# Patient Record
Sex: Female | Born: 1965 | Race: White | Hispanic: No | Marital: Married | Smoking: Never smoker
Health system: Southern US, Community
[De-identification: ages and names within clinical notes are randomized; demographics above are authoritative.]

## PROBLEM LIST (undated history)

## (undated) DIAGNOSIS — T7840XA Allergy, unspecified, initial encounter: Secondary | ICD-10-CM

## (undated) DIAGNOSIS — K649 Unspecified hemorrhoids: Secondary | ICD-10-CM

## (undated) DIAGNOSIS — M8580 Other specified disorders of bone density and structure, unspecified site: Secondary | ICD-10-CM

## (undated) DIAGNOSIS — M1990 Unspecified osteoarthritis, unspecified site: Secondary | ICD-10-CM

## (undated) DIAGNOSIS — G4700 Insomnia, unspecified: Secondary | ICD-10-CM

## (undated) DIAGNOSIS — K635 Polyp of colon: Secondary | ICD-10-CM

## (undated) HISTORY — DX: Unspecified hemorrhoids: K64.9

## (undated) HISTORY — DX: Insomnia, unspecified: G47.00

## (undated) HISTORY — DX: Unspecified osteoarthritis, unspecified site: M19.90

## (undated) HISTORY — DX: Allergy, unspecified, initial encounter: T78.40XA

## (undated) HISTORY — DX: Other specified disorders of bone density and structure, unspecified site: M85.80

## (undated) HISTORY — DX: Polyp of colon: K63.5

---
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
## 1998-10-31 ENCOUNTER — Ambulatory Visit (HOSPITAL_COMMUNITY): Admission: RE | Admit: 1998-10-31 | Discharge: 1998-10-31 | Payer: Self-pay | Admitting: Obstetrics and Gynecology

## 1999-07-24 ENCOUNTER — Inpatient Hospital Stay (HOSPITAL_COMMUNITY): Admission: AD | Admit: 1999-07-24 | Discharge: 1999-07-28 | Payer: Self-pay | Admitting: *Deleted

## 2002-01-28 ENCOUNTER — Other Ambulatory Visit: Admission: RE | Admit: 2002-01-28 | Discharge: 2002-01-28 | Payer: Self-pay | Admitting: Obstetrics and Gynecology

## 2003-04-20 ENCOUNTER — Other Ambulatory Visit: Admission: RE | Admit: 2003-04-20 | Discharge: 2003-04-20 | Payer: Self-pay | Admitting: Obstetrics and Gynecology

## 2004-05-31 ENCOUNTER — Other Ambulatory Visit: Admission: RE | Admit: 2004-05-31 | Discharge: 2004-05-31 | Payer: Self-pay | Admitting: Obstetrics and Gynecology

## 2005-02-02 ENCOUNTER — Ambulatory Visit (HOSPITAL_COMMUNITY): Admission: RE | Admit: 2005-02-02 | Discharge: 2005-02-02 | Payer: Self-pay | Admitting: Family Medicine

## 2005-09-10 ENCOUNTER — Other Ambulatory Visit: Admission: RE | Admit: 2005-09-10 | Discharge: 2005-09-10 | Payer: Self-pay | Admitting: Obstetrics and Gynecology

## 2013-10-15 DIAGNOSIS — M8580 Other specified disorders of bone density and structure, unspecified site: Secondary | ICD-10-CM

## 2013-10-15 HISTORY — DX: Other specified disorders of bone density and structure, unspecified site: M85.80

## 2015-10-16 DIAGNOSIS — K635 Polyp of colon: Secondary | ICD-10-CM

## 2015-10-16 HISTORY — PX: BUNIONECTOMY: SHX129

## 2015-10-16 HISTORY — DX: Polyp of colon: K63.5

## 2015-12-26 ENCOUNTER — Telehealth: Payer: Self-pay | Admitting: Family Medicine

## 2015-12-26 NOTE — Telephone Encounter (Signed)
Pt would like to know if you would accept her as a pt?  Pt has [employer], and was referred by one of Dr Julianne Rice pt's. Please advise if she will accept?

## 2016-01-04 NOTE — Telephone Encounter (Signed)
OK to schedule

## 2016-01-05 NOTE — Telephone Encounter (Signed)
Left message to call back  

## 2016-01-09 NOTE — Telephone Encounter (Signed)
Pt has decided to go somewhere else.

## 2016-01-24 ENCOUNTER — Encounter: Payer: Self-pay | Admitting: Family Medicine

## 2016-01-24 DIAGNOSIS — Z124 Encounter for screening for malignant neoplasm of cervix: Secondary | ICD-10-CM | POA: Insufficient documentation

## 2016-01-24 DIAGNOSIS — Z79899 Other long term (current) drug therapy: Secondary | ICD-10-CM | POA: Insufficient documentation

## 2016-01-24 DIAGNOSIS — Z780 Asymptomatic menopausal state: Secondary | ICD-10-CM

## 2016-01-24 DIAGNOSIS — M8580 Other specified disorders of bone density and structure, unspecified site: Secondary | ICD-10-CM | POA: Insufficient documentation

## 2016-01-24 HISTORY — DX: Asymptomatic menopausal state: Z78.0

## 2016-03-15 ENCOUNTER — Ambulatory Visit (INDEPENDENT_AMBULATORY_CARE_PROVIDER_SITE_OTHER): Payer: 59 | Admitting: Family Medicine

## 2016-03-15 ENCOUNTER — Encounter: Payer: Self-pay | Admitting: Family Medicine

## 2016-03-15 VITALS — BP 129/89 | HR 78 | Temp 98.3°F | Resp 20 | Ht 62.5 in | Wt 114.8 lb

## 2016-03-15 DIAGNOSIS — J0100 Acute maxillary sinusitis, unspecified: Secondary | ICD-10-CM | POA: Diagnosis not present

## 2016-03-15 DIAGNOSIS — Z7689 Persons encountering health services in other specified circumstances: Secondary | ICD-10-CM

## 2016-03-15 DIAGNOSIS — Z7189 Other specified counseling: Secondary | ICD-10-CM | POA: Diagnosis not present

## 2016-03-15 HISTORY — PX: HEMORRHOID BANDING: SHX5850

## 2016-03-15 MED ORDER — AZITHROMYCIN 250 MG PO TABS: ORAL_TABLET | ORAL | Status: DC

## 2016-03-15 NOTE — Progress Notes (Signed)
Patient ID: Sheena Brennan, female   DOB: July 03, 1966, 50 y.o.   MRN: [id number]    Sheena Brennan , 04/12/1966, 50 y.o., female MRN: [id number]  CC: fever, establish care Subjective: Pt presents for an acute OV with complaints of fever of 2 days duration. Associated symptoms include muscle aches, nausea, no appetite, sore throat, headache.  Fever tmax 101F. patient reports some of her family members all her sick, but not will same illness. She had been exposed to her infant nephew over the weekend, who had some nasal drainage. Pt denies nausea, diarrhea, abdominal pain, rash, nasal congestion, PND, cough, wheezing, shortness of breath. Pt has tried tylenol for fever to ease their symptoms.  Patient presents for new patient establishment. All past medical history, surgical history, allergies, family history, immunizations and social history was obtained from the patient today and entered into the electronic medical record. Records reviewed from prior PCP.    Allergies  Allergen Reactions  . Tetracyclines & Related Nausea And Vomiting   Social History  Substance Use Topics  . Smoking status: Never Smoker   . Smokeless tobacco: Never Used  . Alcohol Use: No   Past Medical History  Diagnosis Date  . Insomnia   . Osteopenia 2015    By DEXA  . Arthritis   . Allergy   . Colon polyp    Past Surgical History  Procedure Laterality Date  . Cesarean section  1997, 2000   Family History  Problem Relation Age of Onset  . Osteoporosis    . Arthritis Mother   . Breast cancer Maternal Grandmother   . Heart disease Father    Social History   Social History Narrative   Married to [employer]. 2 children (eric and brian)   Bachelors degree, Optometrist, employed.   Denies tobacco, drug or alcohol use.   Drinks caffeinated beverages.   Takes a daily vitamin.   Exercise routinely.   Wears her seatbelt, smoke detector in the home   Firearms in a locked cabinet In the home   Feels safe in her  relationships.      Medication List       This list is accurate as of: 03/15/16 11:59 PM.  Always use your most recent med list.               azithromycin 250 MG tablet  Commonly known as:  ZITHROMAX  500 mg day 1, then 250 mg     zolpidem 10 MG tablet  Commonly known as:  AMBIEN  Take 10 mg by mouth at bedtime as needed.       ROS: Negative, with the exception of above mentioned in HPI  Objective:  BP 129/89 mmHg  Pulse 78  Temp(Src) 98.3 F (36.8 C) (Oral)  Resp 20  Ht 5' 2.5" (1.588 m)  Wt 114 lb 12 oz (52.05 kg)  BMI 20.64 kg/m2  SpO2 97% Body mass index is 20.64 kg/(m^2). Gen: Afebrile. No acute distress. On toxic in appearance, well-developed, well-nourished female. Pleasant. HENT: AT. [place]. Bilateral TM visualized, with air-fluid levels bilaterally. Tacky mucous membranes, no oral lesions. Bilateral nares erythema and swelling. Throat without erythema or exudates. Mild cough on exam, no hoarseness on exam. No tenderness to palpation maxillary or frontal sinuses. Eyes:Pupils Equal Round Reactive to light, Extraocular movements intact,  Conjunctiva without redness, discharge or icterus. Neck/lymp/endocrine: Supple, no lymphadenopath CV: RRR  Chest: CTAB, no wheeze or crackles. Good air movement, normal resp effort.  Abd: Soft. NTND.  BS present Skin: No rashes, purpura or petechiae.  Neuro: Normal gait. PERLA. EOMi. Alert. Oriented x3 . Psych: Normal affect, dress and demeanor. Normal speech. Normal thought content and judgment.   Assessment/Plan: Sheena Brennan is a 50 y.o. female present for establishment of care and acute OV for  Acute maxillary sinusitis, recurrence not specified - Flonase, Mucinex, rest, hydrate. Antibiotic prescribed and printed, only to be filled if no improvement after 5 days of illness. Or worsening symptoms. - azithromycin (ZITHROMAX) 250 MG tablet; 500 mg day 1, then 250 mg  Dispense: 6 tablet; Refill: 0  Greater than 45 minutes was  spent with patient, greater than 50% of that time was spent face-to-face with patient counseling and coordinating care.   electronically signed by:  Howard Pouch, DO  [place]

## 2016-03-15 NOTE — Patient Instructions (Signed)
Flonase, Mucinex.  REST, HYDRATE Z-pack start on Saturday if not improving.    Adenovirus Adenoviruses are common viruses that cause many different types of infections. The common cold (upper respiratory infection) is the most common type of infection from an adenovirus. Adenoviruses can also infect your digestive system, eyes, lungs, and bladder. An adenovirus spreads easily from person to person. This is especially true if you are in close contact with someone who is sick. You can breathe in the virus after a sick person coughs or sneezes. Adenoviruses can live outside the body for many weeks. Therefore, you can also get sick after touching an object the virus is living on and then touching your nose or mouth. Adenovirus infections are usually not serious unless you have another health problem that makes it hard for you to fight off the infection.  CAUSES  There are more than 50 types of adenoviruses that can cause infections in humans. Different types of adenoviruses cause different types of infection.  RISK FACTORS The risk for an adenovirus infection is higher for:  People who spend a lot of time in places where there are lots of other people. This includes schools, summer camps, and day care centers.  Babies.  Elderly people.  People with a weak body defense system (immune system).  People with heart or lung disease. SIGNS AND SYMPTOMS  It can take 2-14 days to develop symptoms after the virus gets into your body. Symptoms may include:  Fever.  Sore throat.  Ear pain or fullness.  Nasal congestion.  Cough.  Difficulty breathing.  Stomachache.  Diarrhea.  Pain while passing urine.  Blood in the urine.  Pinkeye (conjunctivitis). DIAGNOSIS  Your health care provider may diagnose an adenovirus infection from your signs and symptoms. A physical exam will also be done. You may have tests to make sure your symptoms are not caused by another type of problem. These can  include a blood test, throat culture, or chest X-ray. TREATMENT  There is no treatment for an adenovirus infection. These infections usually clear up on their own with home care. Your health care provider may recommend over-the-counter medicine to help relieve a sore throat, fever, or headache. HOME CARE INSTRUCTIONS  Rest at home until your symptoms go away.  Drink enough fluid to keep your urine clear or pale yellow.  Take medicines only as directed by your health care provider. PREVENTION   Wash your hands often with soap and water.  Avoid close contact with people who are sick.  Do not go to school or work when you are sick.  Cover your nose or mouth when you sneeze or cough. SEEK MEDICAL CARE IF: Your symptoms of adenovirus infection do not clear up or are getting worse after several days. SEEK IMMEDIATE MEDICAL CARE IF: You have trouble breathing. MAKE SURE YOU:  Understand these instructions.  Will watch your condition.  Will get help right away if you are not doing well or get worse.   This information is not intended to replace advice given to you by your health care provider. Make sure you discuss any questions you have with your health care provider.   Document Released: 12/22/2002 Document Revised: 10/22/2014 Document Reviewed: 02/03/2014 Elsevier Interactive Patient Education [employer].

## 2016-03-19 ENCOUNTER — Encounter: Payer: Self-pay | Admitting: Family Medicine

## 2016-03-19 ENCOUNTER — Ambulatory Visit: Payer: Self-pay | Admitting: Family Medicine

## 2016-04-09 ENCOUNTER — Encounter: Payer: Self-pay | Admitting: Family Medicine

## 2016-04-09 ENCOUNTER — Ambulatory Visit (INDEPENDENT_AMBULATORY_CARE_PROVIDER_SITE_OTHER): Payer: 59 | Admitting: Family Medicine

## 2016-04-09 VITALS — BP 115/80 | HR 70 | Temp 98.3°F | Resp 20 | Ht 63.0 in | Wt 114.5 lb

## 2016-04-09 DIAGNOSIS — R0683 Snoring: Secondary | ICD-10-CM | POA: Diagnosis not present

## 2016-04-09 DIAGNOSIS — Z23 Encounter for immunization: Secondary | ICD-10-CM

## 2016-04-09 DIAGNOSIS — Z131 Encounter for screening for diabetes mellitus: Secondary | ICD-10-CM

## 2016-04-09 DIAGNOSIS — J342 Deviated nasal septum: Secondary | ICD-10-CM

## 2016-04-09 DIAGNOSIS — E559 Vitamin D deficiency, unspecified: Secondary | ICD-10-CM

## 2016-04-09 DIAGNOSIS — R6889 Other general symptoms and signs: Secondary | ICD-10-CM

## 2016-04-09 DIAGNOSIS — Z0001 Encounter for general adult medical examination with abnormal findings: Secondary | ICD-10-CM

## 2016-04-09 DIAGNOSIS — Z114 Encounter for screening for human immunodeficiency virus [HIV]: Secondary | ICD-10-CM

## 2016-04-09 DIAGNOSIS — Z1329 Encounter for screening for other suspected endocrine disorder: Secondary | ICD-10-CM

## 2016-04-09 DIAGNOSIS — G4700 Insomnia, unspecified: Secondary | ICD-10-CM | POA: Insufficient documentation

## 2016-04-09 DIAGNOSIS — Z13220 Encounter for screening for lipoid disorders: Secondary | ICD-10-CM

## 2016-04-09 DIAGNOSIS — Z130 Encounter for screening for diseases of the blood and blood-forming organs and certain disorders involving the immune mechanism: Secondary | ICD-10-CM | POA: Diagnosis not present

## 2016-04-09 DIAGNOSIS — M8580 Other specified disorders of bone density and structure, unspecified site: Secondary | ICD-10-CM

## 2016-04-09 DIAGNOSIS — Z1159 Encounter for screening for other viral diseases: Secondary | ICD-10-CM

## 2016-04-09 LAB — LIPID PANEL
Cholesterol: 209 mg/dL — ABNORMAL HIGH (ref 0–200)
HDL: 65.9 mg/dL (ref >39.00)
LDL CALC: 129 mg/dL — AB (ref 0–99)
NonHDL: 143.47
Total CHOL/HDL Ratio: 3
Triglycerides: 71 mg/dL (ref 0.0–149.0)
VLDL: 14.2 mg/dL (ref 0.0–40.0)

## 2016-04-09 LAB — CBC WITH DIFFERENTIAL/PLATELET
BASOS ABS: 0 10*3/uL (ref 0.0–0.1)
Basophils Relative: 0.8 % (ref 0.0–3.0)
EOS ABS: 0 10*3/uL (ref 0.0–0.7)
Eosinophils Relative: 0.4 % (ref 0.0–5.0)
HCT: 41.8 % (ref 36.0–46.0)
Hemoglobin: 14.1 g/dL (ref 12.0–15.0)
LYMPHS ABS: 1.9 10*3/uL (ref 0.7–4.0)
LYMPHS PCT: 40.1 % (ref 12.0–46.0)
MCHC: 33.8 g/dL (ref 30.0–36.0)
MCV: 91.4 fl (ref 78.0–100.0)
Monocytes Absolute: 0.3 10*3/uL (ref 0.1–1.0)
Monocytes Relative: 7 % (ref 3.0–12.0)
NEUTROS ABS: 2.5 10*3/uL (ref 1.4–7.7)
NEUTROS PCT: 51.7 % (ref 43.0–77.0)
PLATELETS: 190 10*3/uL (ref 150.0–400.0)
RBC: 4.57 Mil/uL (ref 3.87–5.11)
RDW: 13.6 % (ref 11.5–15.5)
WBC: 4.8 10*3/uL (ref 4.0–10.5)

## 2016-04-09 LAB — COMPREHENSIVE METABOLIC PANEL
ALT: 14 U/L (ref 0–35)
AST: 21 U/L (ref 0–37)
Albumin: 4.4 g/dL (ref 3.5–5.2)
Alkaline Phosphatase: 55 U/L (ref 39–117)
BILIRUBIN TOTAL: 1.3 mg/dL — AB (ref 0.2–1.2)
BUN: 15 mg/dL (ref 6–23)
CO2: 30 meq/L (ref 19–32)
CREATININE: 0.71 mg/dL (ref 0.40–1.20)
Calcium: 9.8 mg/dL (ref 8.4–10.5)
Chloride: 102 mEq/L (ref 96–112)
GFR: 91.74 mL/min (ref >60.00)
GLUCOSE: 84 mg/dL (ref 70–99)
Potassium: 3.5 mEq/L (ref 3.5–5.1)
SODIUM: 138 meq/L (ref 135–145)
Total Protein: 6.9 g/dL (ref 6.0–8.3)

## 2016-04-09 LAB — TSH: TSH: 1.46 u[IU]/mL (ref 0.35–4.50)

## 2016-04-09 LAB — VITAMIN D 25 HYDROXY (VIT D DEFICIENCY, FRACTURES): VITD: 36.17 ng/mL (ref 30.00–100.00)

## 2016-04-09 LAB — HEMOGLOBIN A1C: Hgb A1c MFr Bld: 5.3 % (ref 4.6–6.5)

## 2016-04-09 MED ORDER — TRAZODONE HCL 50 MG PO TABS: 25.0000 mg | ORAL_TABLET | Freq: Every evening | ORAL | Status: DC | PRN
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
## 2016-04-09 NOTE — Patient Instructions (Addendum)
Health Maintenance, Female Adopting a healthy lifestyle and getting preventive care can go a long way to promote health and wellness. Talk with your health care provider about what schedule of regular examinations is right for you. This is a good chance for you to check in with your provider about disease prevention and staying healthy. In between checkups, there are plenty of things you can do on your own. Experts have done a lot of research about which lifestyle changes and preventive measures are most likely to keep you healthy. Ask your health care provider for more information. WEIGHT AND DIET  Eat a healthy diet  Be sure to include plenty of vegetables, fruits, low-fat dairy products, and lean protein.  Do not eat a lot of foods high in solid fats, added sugars, or salt.  Get regular exercise. This is one of the most important things you can do for your health.  Most adults should exercise for at least 150 minutes each week. The exercise should increase your heart rate and make you sweat (moderate-intensity exercise).  Most adults should also do strengthening exercises at least twice a week. This is in addition to the moderate-intensity exercise.  Maintain a healthy weight  Body mass index (BMI) is a measurement that can be used to identify possible weight problems. It estimates body fat based on height and weight. Your health care provider can help determine your BMI and help you achieve or maintain a healthy weight.  For females 20 years of age and older:   A BMI below 18.5 is considered underweight.  A BMI of 18.5 to 24.9 is normal.  A BMI of 25 to 29.9 is considered overweight.  A BMI of 30 and above is considered obese.  Watch levels of cholesterol and blood lipids  You should start having your blood tested for lipids and cholesterol at 52 years of age, then have this test every 5 years.  You may need to have your cholesterol levels checked more often if:  Your lipid  or cholesterol levels are high.  You are older than 52 years of age.  You are at high risk for heart disease.  CANCER SCREENING   Lung Cancer  Lung cancer screening is recommended for adults 55-80 years old who are at high risk for lung cancer because of a history of smoking.  A yearly low-dose CT scan of the lungs is recommended for people who:  Currently smoke.  Have quit within the past 15 years.  Have at least a 30-pack-year history of smoking. A pack year is smoking an average of one pack of cigarettes a day for 1 year.  Yearly screening should continue until it has been 15 years since you quit.  Yearly screening should stop if you develop a health problem that would prevent you from having lung cancer treatment.  Breast Cancer  Practice breast self-awareness. This means understanding how your breasts normally appear and feel.  It also means doing regular breast self-exams. Let your health care provider know about any changes, no matter how small.  If you are in your 20s or 30s, you should have a clinical breast exam (CBE) by a health care provider every 1-3 years as part of a regular health exam.  If you are 40 or older, have a CBE every year. Also consider having a breast X-ray (mammogram) every year.  If you have a family history of breast cancer, talk to your health care provider about genetic screening.  If you   are at high risk for breast cancer, talk to your health care provider about having an MRI and a mammogram every year.  Breast cancer gene (BRCA) assessment is recommended for women who have family members with BRCA-related cancers. BRCA-related cancers include:  Breast.  Ovarian.  Tubal.  Peritoneal cancers.  Results of the assessment will determine the need for genetic counseling and BRCA1 and BRCA2 testing. Cervical Cancer Your health care provider may recommend that you be screened regularly for cancer of the pelvic organs (ovaries, uterus, and  vagina). This screening involves a pelvic examination, including checking for microscopic changes to the surface of your cervix (Pap test). You may be encouraged to have this screening done every 3 years, beginning at age 21.  For women ages 30-65, health care providers may recommend pelvic exams and Pap testing every 3 years, or they may recommend the Pap and pelvic exam, combined with testing for human papilloma virus (HPV), every 5 years. Some types of HPV increase your risk of cervical cancer. Testing for HPV may also be done on women of any age with unclear Pap test results.  Other health care providers may not recommend any screening for nonpregnant women who are considered low risk for pelvic cancer and who do not have symptoms. Ask your health care provider if a screening pelvic exam is right for you.  If you have had past treatment for cervical cancer or a condition that could lead to cancer, you need Pap tests and screening for cancer for at least 20 years after your treatment. If Pap tests have been discontinued, your risk factors (such as having a new sexual partner) need to be reassessed to determine if screening should resume. Some women have medical problems that increase the chance of getting cervical cancer. In these cases, your health care provider may recommend more frequent screening and Pap tests. Colorectal Cancer  This type of cancer can be detected and often prevented.  Routine colorectal cancer screening usually begins at 52 years of age and continues through 52 years of age.  Your health care provider may recommend screening at an earlier age if you have risk factors for colon cancer.  Your health care provider may also recommend using home test kits to check for hidden blood in the stool.  A small camera at the end of a tube can be used to examine your colon directly (sigmoidoscopy or colonoscopy). This is done to check for the earliest forms of colorectal  cancer.  Routine screening usually begins at age 50.  Direct examination of the colon should be repeated every 5-10 years through 52 years of age. However, you may need to be screened more often if early forms of precancerous polyps or small growths are found. Skin Cancer  Check your skin from head to toe regularly.  Tell your health care provider about any new moles or changes in moles, especially if there is a change in a mole's shape or color.  Also tell your health care provider if you have a mole that is larger than the size of a pencil eraser.  Always use sunscreen. Apply sunscreen liberally and repeatedly throughout the day.  Protect yourself by wearing long sleeves, pants, a wide-brimmed hat, and sunglasses whenever you are outside. HEART DISEASE, DIABETES, AND HIGH BLOOD PRESSURE   High blood pressure causes heart disease and increases the risk of stroke. High blood pressure is more likely to develop in:  People who have blood pressure in the high end   of the normal range (130-139/85-89 mm Hg).  People who are overweight or obese.  People who are African American.  If you are 38-23 years of age, have your blood pressure checked every 3-5 years. If you are 61 years of age or older, have your blood pressure checked every year. You should have your blood pressure measured twice--once when you are at a hospital or clinic, and once when you are not at a hospital or clinic. Record the average of the two measurements. To check your blood pressure when you are not at a hospital or clinic, you can use:  An automated blood pressure machine at a pharmacy.  A home blood pressure monitor.  If you are between 45 years and 39 years old, ask your health care provider if you should take aspirin to prevent strokes.  Have regular diabetes screenings. This involves taking a blood sample to check your fasting blood sugar level.  If you are at a normal weight and have a low risk for diabetes,  have this test once every three years after 52 years of age.  If you are overweight and have a high risk for diabetes, consider being tested at a younger age or more often. PREVENTING INFECTION  Hepatitis B  If you have a higher risk for hepatitis B, you should be screened for this virus. You are considered at high risk for hepatitis B if:  You were born in a country where hepatitis B is common. Ask your health care provider which countries are considered high risk.  Your parents were born in a high-risk country, and you have not been immunized against hepatitis B (hepatitis B vaccine).  You have HIV or AIDS.  You use needles to inject street drugs.  You live with someone who has hepatitis B.  You have had sex with someone who has hepatitis B.  You get hemodialysis treatment.  You take certain medicines for conditions, including cancer, organ transplantation, and autoimmune conditions. Hepatitis C  Blood testing is recommended for:  Everyone born from [birth year] through 1967.  Anyone with known risk factors for hepatitis C. Sexually transmitted infections (STIs)  You should be screened for sexually transmitted infections (STIs) including gonorrhea and chlamydia if:  You are sexually active and are younger than 52 years of age.  You are older than 53 years of age and your health care provider tells you that you are at risk for this type of infection.  Your sexual activity has changed since you were last screened and you are at an increased risk for chlamydia or gonorrhea. Ask your health care provider if you are at risk.  If you do not have HIV, but are at risk, it may be recommended that you take a prescription medicine daily to prevent HIV infection. This is called pre-exposure prophylaxis (PrEP). You are considered at risk if:  You are sexually active and do not regularly use condoms or know the HIV status of your partner(s).  You take drugs by injection.  You are sexually  active with a partner who has HIV. Talk with your health care provider about whether you are at high risk of being infected with HIV. If you choose to begin PrEP, you should first be tested for HIV. You should then be tested every 3 months for as long as you are taking PrEP.  PREGNANCY   If you are premenopausal and you may become pregnant, ask your health care provider about preconception counseling.  If you may  become pregnant, take 400 to 800 micrograms (mcg) of folic acid every day.  If you want to prevent pregnancy, talk to your health care provider about birth control (contraception). OSTEOPOROSIS AND MENOPAUSE   Osteoporosis is a disease in which the bones lose minerals and strength with aging. This can result in serious bone fractures. Your risk for osteoporosis can be identified using a bone density scan.  If you are 65 years of age or older, or if you are at risk for osteoporosis and fractures, ask your health care provider if you should be screened.  Ask your health care provider whether you should take a calcium or vitamin D supplement to lower your risk for osteoporosis.  Menopause may have certain physical symptoms and risks.  Hormone replacement therapy may reduce some of these symptoms and risks. Talk to your health care provider about whether hormone replacement therapy is right for you.  HOME CARE INSTRUCTIONS   Schedule regular health, dental, and eye exams.  Stay current with your immunizations.   Do not use any tobacco products including cigarettes, chewing tobacco, or electronic cigarettes.  If you are pregnant, do not drink alcohol.  If you are breastfeeding, limit how much and how often you drink alcohol.  Limit alcohol intake to no more than 1 drink per day for nonpregnant women. One drink equals 12 ounces of beer, 5 ounces of wine, or 1 ounces of hard liquor.  Do not use street drugs.  Do not share needles.  Ask your health care provider for help if  you need support or information about quitting drugs.  Tell your health care provider if you often feel depressed.  Tell your health care provider if you have ever been abused or do not feel safe at home.   This information is not intended to replace advice given to you by your health care provider. Make sure you discuss any questions you have with your health care provider.   Document Released: 04/16/2011 Document Revised: 10/22/2014 Document Reviewed: 09/02/2013 Elsevier Interactive Patient Education 2016 [employer]  1000-1200 calcium a day. I will check your Vitmain D.

## 2016-04-10 ENCOUNTER — Telehealth: Payer: Self-pay | Admitting: Family Medicine

## 2016-04-10 LAB — HEPATITIS C ANTIBODY: HCV AB: NEGATIVE

## 2016-04-10 LAB — HIV ANTIBODY (ROUTINE TESTING W REFLEX): HIV 1&2 Ab, 4th Generation: NONREACTIVE

## 2016-04-10 NOTE — Telephone Encounter (Signed)
Please call pt: - all of her labs look good. Her vitamin D level is low normal. She could benefit from increasing her dose (not certain total she is taking now).

## 2016-04-10 NOTE — Telephone Encounter (Signed)
Spoke with patient reviewed lab results and instructions. 

## 2016-07-16 LAB — HM MAMMOGRAPHY

## 2016-08-08 LAB — HM PAP SMEAR: HM Pap smear: NORMAL

## 2016-10-22 DIAGNOSIS — M2011 Hallux valgus (acquired), right foot: Secondary | ICD-10-CM | POA: Diagnosis not present

## 2016-10-22 DIAGNOSIS — M2012 Hallux valgus (acquired), left foot: Secondary | ICD-10-CM | POA: Diagnosis not present

## 2016-11-03 DIAGNOSIS — M21611 Bunion of right foot: Secondary | ICD-10-CM | POA: Diagnosis not present

## 2016-11-03 DIAGNOSIS — M21612 Bunion of left foot: Secondary | ICD-10-CM | POA: Diagnosis not present

## 2016-11-07 DIAGNOSIS — M21612 Bunion of left foot: Secondary | ICD-10-CM | POA: Diagnosis not present

## 2016-11-07 DIAGNOSIS — M21611 Bunion of right foot: Secondary | ICD-10-CM | POA: Diagnosis not present

## 2016-11-12 DIAGNOSIS — M21611 Bunion of right foot: Secondary | ICD-10-CM | POA: Diagnosis not present

## 2016-11-12 DIAGNOSIS — M21612 Bunion of left foot: Secondary | ICD-10-CM | POA: Diagnosis not present

## 2016-11-16 DIAGNOSIS — M21612 Bunion of left foot: Secondary | ICD-10-CM | POA: Diagnosis not present

## 2016-11-16 DIAGNOSIS — M21611 Bunion of right foot: Secondary | ICD-10-CM | POA: Diagnosis not present

## 2016-11-21 DIAGNOSIS — M21612 Bunion of left foot: Secondary | ICD-10-CM | POA: Diagnosis not present

## 2016-11-21 DIAGNOSIS — M21611 Bunion of right foot: Secondary | ICD-10-CM | POA: Diagnosis not present

## 2017-03-13 ENCOUNTER — Encounter: Payer: Self-pay | Admitting: Family Medicine

## 2017-03-13 ENCOUNTER — Ambulatory Visit (INDEPENDENT_AMBULATORY_CARE_PROVIDER_SITE_OTHER): Payer: 59 | Admitting: Family Medicine

## 2017-03-13 VITALS — BP 124/81 | HR 84 | Temp 99.7°F | Resp 18 | Wt 115.5 lb

## 2017-03-13 DIAGNOSIS — R110 Nausea: Secondary | ICD-10-CM | POA: Diagnosis not present

## 2017-03-13 DIAGNOSIS — R509 Fever, unspecified: Secondary | ICD-10-CM | POA: Diagnosis not present

## 2017-03-13 DIAGNOSIS — R8290 Unspecified abnormal findings in urine: Secondary | ICD-10-CM

## 2017-03-13 DIAGNOSIS — R319 Hematuria, unspecified: Secondary | ICD-10-CM | POA: Diagnosis not present

## 2017-03-13 HISTORY — DX: Hematuria, unspecified: R31.9

## 2017-03-13 LAB — POC URINALSYSI DIPSTICK (AUTOMATED)
GLUCOSE UA: NEGATIVE
Leukocytes, UA: NEGATIVE
NITRITE UA: NEGATIVE
Protein, UA: 30
Spec Grav, UA: 1.025 (ref 1.010–1.025)
Urobilinogen, UA: 0.2 E.U./dL
pH, UA: 6 (ref 5.0–8.0)

## 2017-03-13 MED ORDER — ONDANSETRON HCL 4 MG PO TABS: 4.0000 mg | ORAL_TABLET | Freq: Three times a day (TID) | ORAL | 0 refills | Status: DC | PRN

## 2017-03-13 NOTE — Patient Instructions (Signed)
You do have a "moderate" content of microscopic blood in your urine.  It does not show evidence of infection. I will send it for culture to make sure.  For now, treat with naproxen every 12 hours for 2-3 days. If you experience pain or worsening symptoms be seen immediately.   I have provided you with a script for zofran in the event nausea worsens. Make sure to stay hydrated as we discussed.    Kidney Stones Kidney stones (urolithiasis) are rock-like masses that form inside of the kidneys. Kidneys are organs that make pee (urine). A kidney stone can cause very bad pain and can block the flow of pee. The stone usually leaves your body (passes) through your pee. You may need to have a doctor take out the stone. Follow these instructions at home: Eating and drinking   Drink enough fluid to keep your pee clear or pale yellow. This will help you pass the stone.  If told by your doctor, change the foods you eat (your diet). This may include:  Limiting how much salt (sodium) you eat.  Eating more fruits and vegetables.  Limiting how much meat, poultry, fish, and eggs you eat.  Follow instructions from your doctor about eating or drinking restrictions. General instructions   Collect pee samples as told by your doctor. You may need to collect a pee sample:  24 hours after a stone comes out.  8-12 weeks after a stone comes out, and every 6-12 months after that.  Strain your pee every time you pee (urinate), for as long as told. Use the strainer that your doctor recommends.  Do not throw out the stone. Keep it so that it can be tested by your doctor.  Take over-the-counter and prescription medicines only as told by your doctor.  Keep all follow-up visits as told by your doctor. This is important. You may need follow-up tests. Preventing kidney stones  To prevent another kidney stone:  Drink enough fluid to keep your pee clear or pale yellow. This is the best way to prevent kidney  stones.  Eat healthy foods.  Avoid certain foods as told by your doctor. You may be told to eat less protein.  Stay at a healthy weight. Contact a doctor if:  You have pain that gets worse or does not get better with medicine. Get help right away if:  You have a fever or chills.  You get very bad pain.  You get new pain in your belly (abdomen).  You pass out (faint).  You cannot pee. This information is not intended to replace advice given to you by your health care provider. Make sure you discuss any questions you have with your health care provider. Document Released: 03/19/2008 Document Revised: 06/19/2016 Document Reviewed: 06/19/2016 Elsevier Interactive Patient Education  2017 [employer].

## 2017-03-13 NOTE — Progress Notes (Addendum)
Sheena Brennan , 09-Oct-1966, 51 y.o., female MRN: [id number] Patient Care Team    Relationship Specialty Notifications Start End  Ma Hillock, DO PCP - General Family Medicine  03/15/16   Richmond Campbell, MD Consulting Physician Gastroenterology  03/19/16   Allyn Kenner, [place] Physician Obstetrics and Gynecology  03/19/16     Chief Complaint  Patient presents with  . Fever    nausea, x 1 day     Subjective: Pt presents for an OV with complaints of fever of 1 day duration.  Associated symptoms include fever 102 F max, Nausea and mild achiness. Pt denies rash, abd pain, dysuria, cough, recent travel, insect bites, h/o kidney stones. Pt endorses nausea, fever and maybe mild right lower back discomfort.  Pt has tried Tylenol to ease their symptoms. She has not been exposed to any known sick contacts.  Depression screen [facility] 2/9 04/09/2016  Decreased Interest 0  Down, Depressed, Hopeless 0  PHQ - 2 Score 0    Allergies  Allergen Reactions  . Demeclocycline Nausea And Vomiting  . Tetracyclines & Related Nausea And Vomiting   Social History  Substance Use Topics  . Smoking status: Never Smoker  . Smokeless tobacco: Never Used  . Alcohol use No   Past Medical History:  Diagnosis Date  . Allergy   . Arthritis   . Colon polyp   . Insomnia   . Osteopenia 2015   By DEXA   Past Surgical History:  Procedure Laterality Date  . BUNIONECTOMY Bilateral 2017  . CESAREAN SECTION  1997, 2000  . SEPTOPLASTY  1981   Family History  Problem Relation Age of Onset  . Osteoporosis Mother   . Arthritis Mother   . Breast cancer Maternal Grandmother   . Heart disease Father    Allergies as of 03/13/2017      Reactions   Demeclocycline Nausea And Vomiting   Tetracyclines & Related Nausea And Vomiting      Medication List       Accurate as of 03/13/17  9:47 AM. Always use your most recent med list.          cholecalciferol 1000 units tablet Commonly known as:   VITAMIN D Take 2,000 Units by mouth daily.   vitamin C 1000 MG tablet Take 1,000 mg by mouth daily.   zolpidem 10 MG tablet Commonly known as:  AMBIEN Take 10 mg by mouth at bedtime as needed.       All past medical history, surgical history, allergies, family history, immunizations andmedications were updated in the EMR today and reviewed under the history and medication portions of their EMR.     ROS: Negative, with the exception of above mentioned in HPI   Objective:  BP 124/81 (BP Location: Right Arm, Patient Position: Sitting, Cuff Size: Normal)   Pulse 84   Temp 99.7 F (37.6 C)   Resp 18   Wt 115 lb 8 oz (52.4 kg)   SpO2 98%   BMI 20.46 kg/m  Body mass index is 20.46 kg/m. Gen: Afebrile. No acute distress. Nontoxic in appearance, well developed, well nourished. Pleasant caucasian female.  HENT: AT. [place]. Bilateral TM visualized without erythema or buldging. MMM, no oral lesions. Bilateral nares without erythema, drainage  or swelling. Throat without erythema or exudates. No cough or hoarseness.  Eyes:Pupils Equal Round Reactive to light, Extraocular movements intact,  Conjunctiva without redness, discharge or icterus. Neck/lymp/endocrine: Supple,no lymphadenopathy CV: RRR  Chest: CTAB, no wheeze  or crackles. Good air movement, normal resp effort.  Abd: Soft. flat. NTND. BS present. no Masses palpated. No rebound or guarding.  MSK: no CVA tenderness bilaterally Skin: no rashes, purpura or petechiae.  Neuro: Normal gait. PERLA. EOMi. Alert. Oriented x3   No exam data present No results found. Results for orders placed or performed in visit on 03/13/17 (from the past 24 hour(s))  POCT Urinalysis Dipstick (Automated)     Status: Abnormal   Collection Time: 03/13/17  9:44 AM  Result Value Ref Range   Color, UA dark yellow    Clarity, UA cloudy    Glucose, UA Negative    Bilirubin, UA Small    Ketones, UA Trace    Spec Grav, UA 1.025 1.010 - 1.025   Blood, UA  Moderate    pH, UA 6.0 5.0 - 8.0   Protein, UA 30    Urobilinogen, UA 0.2 0.2 or 1.0 E.U./dL   Nitrite, UA Negative    Leukocytes, UA Negative Negative    Assessment/Plan: LYLIE Brennan is a 51 y.o. female present for OV for  Fever, unspecified fever cause/nausea Abnormal urine findings/hematuria - Uncertain etiology of fever, possibly viral illness/early gastroenteritis vs kidney stone. Provided zofran script for nausea. Pt given recommendations if illness turns into gastro and also bladder/kidney stone.  - POCT Urinalysis Dipstick (Automated)--> mod blood. Pt reports she is always told she is has some blood in her urine from her GYN, we have no prior urine collections to compare. Concern for potential kidney stone, precautions provided to patient and when to seek emergent care. - Urine Culture sent - naproxen, push fluids and return if develop pain, decreased urine output or symptoms fever worsen. Treat symptomatically for now. F/U PRN  Reviewed expectations re: course of current medical issues.  Discussed self-management of symptoms.  Outlined signs and symptoms indicating need for more acute intervention.  Patient verbalized understanding and all questions were answered.  Patient received an After-Visit Summary.   Note is dictated utilizing voice recognition software. Although note has been proof read prior to signing, occasional typographical errors still can be missed. If any questions arise, please do not hesitate to call for verification.   electronically signed by:  Howard Pouch, DO  [place]

## 2017-03-14 LAB — URINE CULTURE

## 2017-03-15 ENCOUNTER — Telehealth: Payer: Self-pay | Admitting: Family Medicine

## 2017-03-15 DIAGNOSIS — M2011 Hallux valgus (acquired), right foot: Secondary | ICD-10-CM | POA: Diagnosis not present

## 2017-03-15 NOTE — Telephone Encounter (Signed)
Please call pt: - urine culture is normal. No growth.

## 2017-03-15 NOTE — Telephone Encounter (Signed)
Spoke with patient reviewed lab results. 

## 2017-04-10 ENCOUNTER — Encounter: Payer: 59 | Admitting: Family Medicine

## 2017-04-12 ENCOUNTER — Ambulatory Visit (INDEPENDENT_AMBULATORY_CARE_PROVIDER_SITE_OTHER): Payer: 59 | Admitting: Family Medicine

## 2017-04-12 ENCOUNTER — Encounter: Payer: Self-pay | Admitting: Family Medicine

## 2017-04-12 VITALS — BP 116/77 | HR 68 | Temp 98.6°F | Resp 20 | Ht 63.0 in | Wt 115.5 lb

## 2017-04-12 DIAGNOSIS — Z131 Encounter for screening for diabetes mellitus: Secondary | ICD-10-CM | POA: Diagnosis not present

## 2017-04-12 DIAGNOSIS — E7800 Pure hypercholesterolemia, unspecified: Secondary | ICD-10-CM

## 2017-04-12 DIAGNOSIS — Z79899 Other long term (current) drug therapy: Secondary | ICD-10-CM | POA: Diagnosis not present

## 2017-04-12 DIAGNOSIS — M8580 Other specified disorders of bone density and structure, unspecified site: Secondary | ICD-10-CM

## 2017-04-12 DIAGNOSIS — Z130 Encounter for screening for diseases of the blood and blood-forming organs and certain disorders involving the immune mechanism: Secondary | ICD-10-CM | POA: Diagnosis not present

## 2017-04-12 DIAGNOSIS — Z0000 Encounter for general adult medical examination without abnormal findings: Secondary | ICD-10-CM | POA: Diagnosis not present

## 2017-04-12 LAB — CBC WITH DIFFERENTIAL/PLATELET
BASOS PCT: 0.9 % (ref 0.0–3.0)
Basophils Absolute: 0 10*3/uL (ref 0.0–0.1)
EOS ABS: 0 10*3/uL (ref 0.0–0.7)
EOS PCT: 0.4 % (ref 0.0–5.0)
HCT: 41.1 % (ref 36.0–46.0)
Hemoglobin: 14 g/dL (ref 12.0–15.0)
Lymphocytes Relative: 38.4 % (ref 12.0–46.0)
Lymphs Abs: 1.6 10*3/uL (ref 0.7–4.0)
MCHC: 34.2 g/dL (ref 30.0–36.0)
MCV: 91.1 fl (ref 78.0–100.0)
MONO ABS: 0.3 10*3/uL (ref 0.1–1.0)
Monocytes Relative: 7.2 % (ref 3.0–12.0)
NEUTROS ABS: 2.2 10*3/uL (ref 1.4–7.7)
Neutrophils Relative %: 53.1 % (ref 43.0–77.0)
PLATELETS: 167 10*3/uL (ref 150.0–400.0)
RBC: 4.51 Mil/uL (ref 3.87–5.11)
RDW: 13.3 % (ref 11.5–15.5)
WBC: 4.1 10*3/uL (ref 4.0–10.5)

## 2017-04-12 LAB — COMPREHENSIVE METABOLIC PANEL
ALT: 11 U/L (ref 0–35)
AST: 17 U/L (ref 0–37)
Albumin: 4.3 g/dL (ref 3.5–5.2)
Alkaline Phosphatase: 52 U/L (ref 39–117)
BUN: 21 mg/dL (ref 6–23)
CALCIUM: 9.6 mg/dL (ref 8.4–10.5)
CHLORIDE: 105 meq/L (ref 96–112)
CO2: 33 meq/L — AB (ref 19–32)
CREATININE: 0.79 mg/dL (ref 0.40–1.20)
GFR: 80.79 mL/min (ref >60.00)
Glucose, Bld: 93 mg/dL (ref 70–99)
POTASSIUM: 4.1 meq/L (ref 3.5–5.1)
SODIUM: 142 meq/L (ref 135–145)
Total Bilirubin: 0.9 mg/dL (ref 0.2–1.2)
Total Protein: 6.7 g/dL (ref 6.0–8.3)

## 2017-04-12 LAB — VITAMIN D 25 HYDROXY (VIT D DEFICIENCY, FRACTURES): VITD: 41.37 ng/mL (ref 30.00–100.00)

## 2017-04-12 LAB — LIPID PANEL
CHOL/HDL RATIO: 3
Cholesterol: 193 mg/dL (ref 0–200)
HDL: 58.4 mg/dL (ref >39.00)
LDL CALC: 117 mg/dL — AB (ref 0–99)
NonHDL: 134.19
TRIGLYCERIDES: 86 mg/dL (ref 0.0–149.0)
VLDL: 17.2 mg/dL (ref 0.0–40.0)

## 2017-04-12 LAB — TSH: TSH: 1.8 u[IU]/mL (ref 0.35–4.50)

## 2017-04-12 NOTE — Progress Notes (Signed)
Patient ID: Sheena Brennan, female  DOB: 1966/06/09, 51 y.o.   MRN: [id number] Patient Care Team    Relationship Specialty Notifications Start End  Ma Hillock, DO PCP - General Family Medicine  03/15/16   Richmond Campbell, MD Consulting Physician Gastroenterology  03/19/16   Arvella Nigh, MD Consulting Physician Obstetrics and Gynecology  04/12/17     Chief Complaint  Patient presents with  . Annual Exam    Subjective:  Sheena Brennan is a 51 y.o.  Female  present for CPE. All past medical history, surgical history, allergies, family history, immunizations, medications and social history were updated in the electronic medical record today. All recent labs, ED visits and hospitalizations within the last year were reviewed.  Health maintenance:  Colonoscopy: every 5 years for polyps, Dr. Earlean Shawl, UTD--> due 2023. Mammogram:UTD with GYN 06/2016 Cervical cancer screening: UTD with GYN [facility] Immunizations: tdap UTD 2017, yearly flu shot encouraged.  Infectious disease screening: HIV and Hep C completed DEXA:osteopenia 2015, taking vit D, not calcium. Assistive device: none Oxygen UXL:KGMW Patient has a Dental home. Hospitalizations/ED visits: none   Depression screen [facility] 2/9 04/12/2017 04/09/2016  Decreased Interest 0 0  Down, Depressed, Hopeless 0 0  PHQ - 2 Score 0 0   No flowsheet data found.   Current Exercise Habits: Structured exercise class, Type of exercise: treadmill;strength training/weights, Time (Minutes): 45, Frequency (Times/Week): 4, Weekly Exercise (Minutes/Week): 180, Intensity: Moderate     Immunization History  Administered Date(s) Administered  . Influenza-Unspecified 07/16/2015  . Tdap 04/09/2016     Past Medical History:  Diagnosis Date  . Allergy   . Arthritis   . Colon polyp   . Insomnia   . Osteopenia 2015   By DEXA   Allergies  Allergen Reactions  . Demeclocycline Nausea And Vomiting  . Tetracyclines & Related Nausea And Vomiting    Past Surgical History:  Procedure Laterality Date  . BUNIONECTOMY Bilateral 2017  . CESAREAN SECTION  1997, 2000  . SEPTOPLASTY  1981   Family History  Problem Relation Age of Onset  . Osteoporosis Mother   . Arthritis Mother   . Breast cancer Maternal Grandmother   . Heart disease Father    Social History   Social History  . Marital status: Married    Spouse name: N/A  . Number of children: N/A  . Years of education: N/A   Occupational History  . Not on file.   Social History Main Topics  . Smoking status: Never Smoker  . Smokeless tobacco: Never Used  . Alcohol use No  . Drug use: No  . Sexual activity: Yes    Partners: Male    Birth control/ protection: Post-menopausal     Comment: Married   Other Topics Concern  . Not on file   Social History Narrative   Married to [place]. 2 children (eric and brian)   Bachelors degree, Optometrist, employed.   Denies tobacco, drug or alcohol use.   Drinks caffeinated beverages.   Takes a daily vitamin.   Exercise routinely.   Wears her seatbelt, smoke detector in the home   Firearms in a locked cabinet In the home   Feels safe in her relationships.   Allergies as of 04/12/2017      Reactions   Demeclocycline Nausea And Vomiting   Tetracyclines & Related Nausea And Vomiting      Medication List       Accurate as of 04/12/17  8:36  AM. Always use your most recent med list.          cholecalciferol 1000 units tablet Commonly known as:  VITAMIN D Take 2,000 Units by mouth daily.   vitamin C 1000 MG tablet Take 1,000 mg by mouth daily.   zolpidem 10 MG tablet Commonly known as:  AMBIEN Take 10 mg by mouth at bedtime as needed.       All past medical history, surgical history, allergies, family history, immunizations andmedications were updated in the EMR today and reviewed under the history and medication portions of their EMR.     Recent Results (from the past 2160 hour(s))  POCT Urinalysis Dipstick  (Automated)     Status: Abnormal   Collection Time: 03/13/17  9:44 AM  Result Value Ref Range   Color, UA dark yellow    Clarity, UA cloudy    Glucose, UA Negative    Bilirubin, UA Small    Ketones, UA Trace    Spec Grav, UA 1.025 1.010 - 1.025   Blood, UA Moderate    pH, UA 6.0 5.0 - 8.0   Protein, UA 30    Urobilinogen, UA 0.2 0.2 or 1.0 E.U./dL   Nitrite, UA Negative    Leukocytes, UA Negative Negative  Urine Culture     Status: None   Collection Time: 03/13/17 10:05 AM  Result Value Ref Range   Organism ID, Bacteria      Multiple organisms present,each less than 10,000 CFU/mL. These organisms,commonly found on external and internal genitalia,are considered colonizers. No further testing performed.     No results found.   ROS: 14 pt review of systems performed and negative (unless mentioned in an HPI)  Objective: BP 116/77 (BP Location: Right Arm, Patient Position: Sitting, Cuff Size: Normal)   Pulse 68   Temp 98.6 F (37 C)   Resp 20   Ht 5' 3" (1.6 m)   Wt 115 lb 8 oz (52.4 kg)   SpO2 98%   BMI 20.46 kg/m  Gen: Afebrile. No acute distress. Nontoxic in appearance, well-developed, well-nourished,  Pleasant female HENT: AT. [place]. Bilateral TM visualized and normal in appearance, normal external auditory canal. MMM, no oral lesions, adequate dentition. Bilateral nares within normal limits. Throat without erythema, ulcerations or exudates. no Cough on exam, no hoarseness on exam. Eyes:Pupils Equal Round Reactive to light, Extraocular movements intact,  Conjunctiva without redness, discharge or icterus. Neck/lymp/endocrine: Supple,no lymphadenopathy, no thyromegaly CV: RRR no murmur, no edema, +2/4 P posterior tibialis pulses. no carotid bruits. No JVD. Chest: CTAB, no wheeze, rhonchi or crackles. normal Respiratory effort. good Air movement. Abd: Soft. flat. NTND. BS present. no Masses palpated. No hepatosplenomegaly. No rebound tenderness or guarding. Skin: no rashes,  purpura or petechiae. Warm and well-perfused. Skin intact. Neuro/Msk:  Normal gait. PERLA. EOMi. Alert. Oriented x3.  Cranial nerves II through XII intact. Muscle strength 5/5 upper/lower extremity. DTRs equal bilaterally. Psych: Normal affect, dress and demeanor. Normal speech. Normal thought content and judgment.   Assessment/plan: Sheena Brennan is a 51 y.o. female present for CPE. Encounter for preventive health examination Patient was encouraged to exercise greater than 150 minutes a week. Patient was encouraged to choose a diet filled with fresh fruits and vegetables, and lean meats. AVS provided to patient today for education/recommendation on gender specific health and safety maintenance. Colonoscopy: every 5 years for polyps, Dr. Earlean Shawl, UTD--> due 2023. Mammogram:UTD with GYN 06/2016 Cervical cancer screening: UTD with GYN [facility] 06/2015 Immunizations: tdap UTD  2017, yearly flu shot encouraged.  Infectious disease screening: HIV and Hep C completed DEXA:osteopenia 2015, taking vit D, not calcium. Osteopenia, unspecified location - Dexa should be repeated, last 2015. Likely will want with GYN, but can have ordered if desiring other location.  - continue vit D, weight baring exercise - Vitamin D (25 hydroxy) - TSH Diabetes mellitus screening - Hemoglobin A1c Screening for deficiency anemia - CBC w/Diff Encounter for long-term (current) use of medications - Comp Met (CMET) Elevated LDL cholesterol level - Lipid panel - TSH  Return in about 1 year (around 04/12/2018) for CPE.  Electronically signed by: Howard Pouch, DO [place]

## 2017-04-12 NOTE — Patient Instructions (Signed)

## 2017-04-13 ENCOUNTER — Telehealth: Payer: Self-pay | Admitting: Family Medicine

## 2017-04-13 LAB — HEMOGLOBIN A1C
Hgb A1c MFr Bld: 5.1 % (ref <5.7)
Mean Plasma Glucose: 100 mg/dL

## 2017-04-13 NOTE — Telephone Encounter (Signed)
Please call pt: - her las are really good. -  Her bone density repeat will be due this year in October. She had her last completed at her GYN. If desired we can put in the order for her (but it would be at a different location), or she can discuss with her gyn. I just wanted to make sure she was aware it was due this year.

## 2017-04-15 NOTE — Telephone Encounter (Signed)
Left message for patient to call back for lab results

## 2017-04-15 NOTE — Telephone Encounter (Signed)
Spoke with patient reviewed lab results and information. Patient states she will follow up with her Gyn to schedule bone density.

## 2017-04-19 ENCOUNTER — Encounter: Payer: Self-pay | Admitting: Family Medicine

## 2017-04-25 ENCOUNTER — Encounter: Payer: Self-pay | Admitting: Family Medicine

## 2017-08-08 DIAGNOSIS — Z01419 Encounter for gynecological examination (general) (routine) without abnormal findings: Secondary | ICD-10-CM | POA: Diagnosis not present

## 2017-08-08 DIAGNOSIS — Z6821 Body mass index (BMI) 21.0-21.9, adult: Secondary | ICD-10-CM | POA: Diagnosis not present

## 2017-08-20 DIAGNOSIS — D225 Melanocytic nevi of trunk: Secondary | ICD-10-CM | POA: Diagnosis not present

## 2017-09-25 DIAGNOSIS — R300 Dysuria: Secondary | ICD-10-CM | POA: Diagnosis not present

## 2017-09-25 DIAGNOSIS — K648 Other hemorrhoids: Secondary | ICD-10-CM | POA: Diagnosis not present

## 2017-10-16 DIAGNOSIS — K648 Other hemorrhoids: Secondary | ICD-10-CM | POA: Diagnosis not present

## 2017-10-22 ENCOUNTER — Encounter: Payer: Self-pay | Admitting: Family Medicine

## 2017-10-22 ENCOUNTER — Ambulatory Visit (INDEPENDENT_AMBULATORY_CARE_PROVIDER_SITE_OTHER): Payer: 59 | Admitting: Family Medicine

## 2017-10-22 VITALS — BP 111/79 | HR 90 | Temp 99.4°F | Resp 18 | Wt 115.0 lb

## 2017-10-22 DIAGNOSIS — R8290 Unspecified abnormal findings in urine: Secondary | ICD-10-CM

## 2017-10-22 DIAGNOSIS — R319 Hematuria, unspecified: Secondary | ICD-10-CM | POA: Diagnosis not present

## 2017-10-22 DIAGNOSIS — R350 Frequency of micturition: Secondary | ICD-10-CM

## 2017-10-22 DIAGNOSIS — R300 Dysuria: Secondary | ICD-10-CM | POA: Insufficient documentation

## 2017-10-22 LAB — POC URINALSYSI DIPSTICK (AUTOMATED)
BILIRUBIN UA: NEGATIVE
GLUCOSE UA: NEGATIVE
KETONES UA: NEGATIVE
Nitrite, UA: POSITIVE
Spec Grav, UA: 1.02 (ref 1.010–1.025)
Urobilinogen, UA: 0.2 E.U./dL
pH, UA: 5 (ref 5.0–8.0)

## 2017-10-22 MED ORDER — CEPHALEXIN 500 MG PO CAPS: 500.0000 mg | ORAL_CAPSULE | Freq: Four times a day (QID) | ORAL | 0 refills | Status: DC

## 2017-10-22 NOTE — Progress Notes (Signed)
Sheena Brennan , 1966-01-07, 52 y.o., female MRN: [id number] Patient Care Team    Relationship Specialty Notifications Start End  Sheena Hillock, DO PCP - General Family Medicine  03/15/16   Sheena Campbell, MD Consulting Physician Gastroenterology  03/19/16   Sheena Nigh, MD Consulting Physician Obstetrics and Gynecology  04/12/17     Chief Complaint  Patient presents with  . Urinary Frequency    burning,fever x 1 day     Subjective: Pt presents for an OV with complaints of fever since last night with urinary frequency, dysuria, fatigue  and feelings of incomplete bladder emptying. She is tolerating PO.  Pt denies rash, abd pain, dysuria, cough, recent travel, insect bites, h/o kidney stones.She has had a h/o hematuria in the past that has had resolution.   Depression screen [facility] 2/9 10/22/2017 04/12/2017 04/09/2016  Decreased Interest 0 0 0  Down, Depressed, Hopeless 0 0 0  PHQ - 2 Score 0 0 0    Allergies  Allergen Reactions  . Demeclocycline Nausea And Vomiting  . Tetracyclines & Related Nausea And Vomiting   Social History   Tobacco Use  . Smoking status: Never Smoker  . Smokeless tobacco: Never Used  Substance Use Topics  . Alcohol use: No    Alcohol/week: 0.0 oz   Past Medical History:  Diagnosis Date  . Allergy   . Arthritis   . Colon polyp 2017   adenoma, Dr. Earlean Shawl  . Hemorrhoids    banded multiple times  . Insomnia   . Osteopenia 2015   By DEXA   Past Surgical History:  Procedure Laterality Date  . BUNIONECTOMY Bilateral 2017  . CESAREAN SECTION  1997, 2000  . HEMORRHOID BANDING  03/2016   6th banding  . SEPTOPLASTY  1981   Family History  Problem Relation Age of Onset  . Osteoporosis Mother   . Arthritis Mother   . Breast cancer Maternal Grandmother   . Heart disease Father    Allergies as of 10/22/2017      Reactions   Demeclocycline Nausea And Vomiting   Tetracyclines & Related Nausea And Vomiting      Medication List        Accurate as  of 10/22/17 10:47 AM. Always use your most recent med list.          alendronate 70 MG tablet Commonly known as:  FOSAMAX Take 70 mg by mouth once a week.   cholecalciferol 1000 units tablet Commonly known as:  VITAMIN D Take 2,000 Units by mouth daily.   clotrimazole-betamethasone cream Commonly known as:  LOTRISONE Apply pea-size amount to affected skin twice daily   vitamin C 1000 MG tablet Take 1,000 mg by mouth daily.   zolpidem 10 MG tablet Commonly known as:  AMBIEN Take 10 mg by mouth at bedtime as needed.       All past medical history, surgical history, allergies, family history, immunizations andmedications were updated in the EMR today and reviewed under the history and medication portions of their EMR.     ROS: Negative, with the exception of above mentioned in HPI   Objective:  BP 111/79 (BP Location: Right Arm, Patient Position: Sitting, Cuff Size: Normal)   Pulse 90   Temp 99.4 F (37.4 C)   Resp 18   Wt 115 lb (52.2 kg)   SpO2 99%   BMI 20.37 kg/m  Body mass index is 20.37 kg/m. Gen: Afebrile. No acute distress. Nontoxic in a appearance, well  developed, well nourished, pleasant caucasian female.  HENT: AT. Sheena Brennan.  MMM.  Eyes:Pupils Equal Round Reactive to light, Extraocular movements intact,  Conjunctiva without redness, discharge or icterus. CV: RRR  Chest: CTAB, no wheeze or crackles Abd: Soft. NTND. BS present. no Masses palpated.  MSK: no CVA tenderness bilaterally.  Skin: no rashes, purpura or petechiae.  Neuro: Normal gait. PERLA. EOMi. Alert. Oriented x3   No exam data present No results found. Results for orders placed or performed in visit on 10/22/17 (from the past 24 hour(s))  POCT Urinalysis Dipstick (Automated)     Status: Abnormal   Collection Time: 10/22/17 10:37 AM  Result Value Ref Range   Color, UA yellow    Clarity, UA cloudy    Glucose, UA negative    Bilirubin, UA negative    Ketones, UA negative    Spec Grav, UA 1.020  1.010 - 1.025   Blood, UA moderate    pH, UA 5.0 5.0 - 8.0   Protein, UA 100mg     Urobilinogen, UA 0.2 0.2 or 1.0 E.U./dL   Nitrite, UA positive    Leukocytes, UA Large (3+) (A) Negative    Assessment/Plan: Sheena Brennan is a 52 y.o. female present for OV for  Fever, unspecified Abnormal urine findings/hematuria - Urinalysis consistent with UTI. Sent for urine culture. - Treat with keflex QID. Precautions reviewed. - rest, HYDRATE.Marland KitchenMarland Kitchenpush fluids.  - F/U PRN   Reviewed expectations re: course of current medical issues.  Discussed self-management of symptoms.  Outlined signs and symptoms indicating need for more acute intervention.  Patient verbalized understanding and all questions were answered.  Patient received an After-Visit Summary.   Note is dictated utilizing voice recognition software. Although note has been proof read prior to signing, occasional typographical errors still can be missed. If any questions arise, please do not hesitate to call for verification.   electronically signed by:  Howard Pouch, DO  [place]

## 2017-10-22 NOTE — Patient Instructions (Signed)
Push fluids (water and cranberry juice) Start keflex today, every 6 hours.   I will call you in 2-3 days with urine culture results.     Urinary Tract Infection, Adult A urinary tract infection (UTI) is an infection of any part of the urinary tract. The urinary tract includes the:  Kidneys.  Ureters.  Bladder.  Urethra.  These organs make, store, and get rid of pee (urine) in the body. Follow these instructions at home:  Take over-the-counter and prescription medicines only as told by your doctor.  If you were prescribed an antibiotic medicine, take it as told by your doctor. Do not stop taking the antibiotic even if you start to feel better.  Avoid the following drinks: ? Alcohol. ? Caffeine. ? Tea. ? Carbonated drinks.  Drink enough fluid to keep your pee clear or pale yellow.  Keep all follow-up visits as told by your doctor. This is important.  Make sure to: ? Empty your bladder often and completely. Do not to hold pee for long periods of time. ? Empty your bladder before and after sex. ? Wipe from front to back after a bowel movement if you are female. Use each tissue one time when you wipe. Contact a doctor if:  You have back pain.  You have a fever.  You feel sick to your stomach (nauseous).  You throw up (vomit).  Your symptoms do not get better after 3 days.  Your symptoms go away and then come back. Get help right away if:  You have very bad back pain.  You have very bad lower belly (abdominal) pain.  You are throwing up and cannot keep down any medicines or water. This information is not intended to replace advice given to you by your health care provider. Make sure you discuss any questions you have with your health care provider. Document Released: 03/19/2008 Document Revised: 03/08/2016 Document Reviewed: 08/22/2015 Elsevier Interactive Patient Education  [employer].

## 2017-10-24 ENCOUNTER — Telehealth: Payer: Self-pay | Admitting: Family Medicine

## 2017-10-24 NOTE — Telephone Encounter (Signed)
Copied from [place] [id number]. Topic: Inquiry >> Oct 24, 2017 12:46 PM Patrice Paradise wrote: Reason for CRM: Patient called for her urine culture lab result that was done on 10/22/17. Patient is requesting a call back asap @ [phone].

## 2017-10-24 NOTE — Telephone Encounter (Signed)
I advised patient that UC is not back yet.  Patient was concerned because she's having more pressure in her back than she was before.  I told her that once the test came back that we would know more and we would call her with results as soon as we got culture back.

## 2017-10-25 LAB — URINE CULTURE
MICRO NUMBER: 90032527
SPECIMEN QUALITY: ADEQUATE

## 2017-10-25 NOTE — Telephone Encounter (Signed)
Spoke with patient reviewed lab results and instructions. Patient verbalized understanding. 

## 2017-10-30 DIAGNOSIS — K648 Other hemorrhoids: Secondary | ICD-10-CM | POA: Diagnosis not present

## 2017-11-01 ENCOUNTER — Encounter: Payer: Self-pay | Admitting: Family Medicine

## 2017-11-01 ENCOUNTER — Ambulatory Visit (INDEPENDENT_AMBULATORY_CARE_PROVIDER_SITE_OTHER): Payer: 59 | Admitting: Family Medicine

## 2017-11-01 ENCOUNTER — Ambulatory Visit: Payer: Self-pay

## 2017-11-01 VITALS — BP 127/71 | HR 71 | Temp 98.2°F | Ht 63.0 in | Wt 116.4 lb

## 2017-11-01 DIAGNOSIS — R21 Rash and other nonspecific skin eruption: Secondary | ICD-10-CM

## 2017-11-01 DIAGNOSIS — L509 Urticaria, unspecified: Secondary | ICD-10-CM | POA: Diagnosis not present

## 2017-11-01 HISTORY — DX: Urticaria, unspecified: L50.9

## 2017-11-01 MED ORDER — METHYLPREDNISOLONE ACETATE 80 MG/ML IJ SUSP
80.0000 mg | Freq: Once | INTRAMUSCULAR | Status: AC
  Administered 2017-11-01: 80 mg via INTRAMUSCULAR

## 2017-11-01 MED ORDER — PREDNISONE 20 MG PO TABS: ORAL_TABLET | ORAL | 0 refills | Status: DC

## 2017-11-01 NOTE — Patient Instructions (Signed)
Start zyrtec or allegra daily (once day) Steroid shot today. If do not help resolve issue or issue worsens over the weekend then start prednisone (printed).      Hives Hives (urticaria) are itchy, red, swollen areas on your skin. Hives can show up on any part of your body, and they can vary in size. They can be as small as the tip of a pen or much larger. Hives often fade within 24 hours (acute hives). In other cases, new hives show up after old ones fade. This can continue for many days or weeks (chronic hives). Hives are caused by your body's reaction to an irritant or to something that you are allergic to (trigger). You can get hives right after being around a trigger or hours later. Hives do not spread from person to person (are not contagious). Hives may get worse if you scratch them, if you exercise, or if you have worries (emotional stress). Follow these instructions at home: Medicines  Take or apply over-the-counter and prescription medicines only as told by your doctor.  If you were prescribed an antibiotic medicine, use it as told by your doctor. Do not stop taking the antibiotic even if you start to feel better. Skin Care  Apply cool, wet cloths (cool compresses) to the itchy, red, swollen areas.  Do not scratch your skin. Do not rub your skin. General instructions  Do not take hot showers or baths. This can make itching worse.  Do not wear tight clothes.  Use sunscreen and wear clothing that covers your skin when you are outside.  Avoid any triggers that cause your hives. Keep a journal to help you keep track of what causes your hives. Write down: ? What medicines you take. ? What you eat and drink. ? What products you use on your skin.  Keep all follow-up visits as told by your doctor. This is important. Contact a doctor if:  Your symptoms are not better with medicine.  Your joints are painful or swollen. Get help right away if:  You have a fever.  You have  belly pain.  Your tongue or lips are swollen.  Your eyelids are swollen.  Your chest or throat feels tight.  You have trouble breathing or swallowing. These symptoms may be an emergency. Do not wait to see if the symptoms will go away. Get medical help right away. Call your local emergency services (911 in the [place] Do not drive yourself to the hospital. This information is not intended to replace advice given to you by your health care provider. Make sure you discuss any questions you have with your health care provider. Document Released: 07/10/2008 Document Revised: 03/08/2016 Document Reviewed: 07/20/2015 Elsevier Interactive Patient Education  2018 [employer].

## 2017-11-01 NOTE — Progress Notes (Signed)
Sheena Brennan , [birth year], 52 y.o., female MRN: [id number] Patient Care Team    Relationship Specialty Notifications Start End  Ma Hillock, DO PCP - General Family Medicine  03/15/16   Richmond Campbell, MD Consulting Physician Gastroenterology  03/19/16   Arvella Nigh, MD Consulting Physician Obstetrics and Gynecology  04/12/17     Chief Complaint  Patient presents with  . Rash    pts complain of sudden itchy rash that started 5pm yesterday.     Subjective: Pt presents for an OV with complaints of itchy rash that started yesterday. Associated symptoms include none. Patient denies any new exposures to medications, personal hygiene products or foods that she is aware of. She has had hives in the past that were secondary to consuming mushrooms. She reports the day before onset she had her third hemorrhoid banding. She usually eats the same diet and groups of foods, but does admit to trying a new soup the day of onset. She is uncertain if this is contained in a mushroom. She denies any shortness of breath, difficulty swallowing or any swelling involving the lips, mouth or tongue. She has not tried any medications. She denies fever, chills or respiratory illness.  Depression screen [facility] 2/9 11/01/2017 10/22/2017 04/12/2017 04/09/2016  Decreased Interest 0 0 0 0  Down, Depressed, Hopeless 0 0 0 0  PHQ - 2 Score 0 0 0 0    Allergies  Allergen Reactions  . Demeclocycline Nausea And Vomiting  . Mushroom Extract Complex     Mushrooms- hives  . Tetracyclines & Related Nausea And Vomiting   Social History   Tobacco Use  . Smoking status: Never Smoker  . Smokeless tobacco: Never Used  Substance Use Topics  . Alcohol use: No    Alcohol/week: 0.0 oz   Past Medical History:  Diagnosis Date  . Allergy   . Arthritis   . Colon polyp 2017   adenoma, Dr. Earlean Shawl  . Hemorrhoids    banded multiple times  . Insomnia   . Osteopenia 2015   By DEXA   Past Surgical History:  Procedure Laterality  Date  . BUNIONECTOMY Bilateral 2017  . CESAREAN SECTION  1997, 2000  . HEMORRHOID BANDING  03/2016   6th banding  . SEPTOPLASTY  1981   Family History  Problem Relation Age of Onset  . Osteoporosis Mother   . Arthritis Mother   . Breast cancer Maternal Grandmother   . Heart disease Father    Allergies as of 11/01/2017      Reactions   Demeclocycline Nausea And Vomiting   Mushroom Extract Complex    Mushrooms- hives   Tetracyclines & Related Nausea And Vomiting      Medication List        Accurate as of 11/01/17 12:07 PM. Always use your most recent med list.          alendronate 70 MG tablet Commonly known as:  FOSAMAX Take 70 mg by mouth once a week.   cholecalciferol 1000 units tablet Commonly known as:  VITAMIN D Take 2,000 Units by mouth daily.   clotrimazole-betamethasone cream Commonly known as:  LOTRISONE Apply pea-size amount to affected skin twice daily   predniSONE 20 MG tablet Commonly known as:  DELTASONE 60 mg x3d, 40 mg x3d, 20 mg x2d, 10 mg x2d   vitamin C 1000 MG tablet Take 1,000 mg by mouth daily.   zolpidem 10 MG tablet Commonly known as:  AMBIEN Take 10 mg  by mouth at bedtime as needed.      All past medical history, surgical history, allergies, family history, immunizations andmedications were updated in the EMR today and reviewed under the history and medication portions of their EMR.     ROS: Negative, with the exception of above mentioned in HPI  Objective:  BP 127/71 (BP Location: Left Arm, Patient Position: Sitting, Cuff Size: Normal)   Pulse 71   Temp 98.2 F (36.8 C) (Oral)   Ht 5\' 3"  (1.6 m)   Wt 116 lb 6.4 oz (52.8 kg)   SpO2 100%   BMI 20.62 kg/m  Body mass index is 20.62 kg/m. Gen: Afebrile. No acute distress. Nontoxic in appearance, well developed, well nourished.  HENT: AT. [place].MMM. Eyes:Pupils Equal Round Reactive to light, Extraocular movements intact,  Conjunctiva without redness, discharge or icterus. CV: RRR   Chest: CTAB, no wheeze or crackles. Good air movement, normal resp effort.  Skin: Diffuse macular papular rash over arms, trunk and thighs.no purpura or petechiae.  Neuro: Normal gait. PERLA. EOMi. Alert. Oriented x3  No exam data present No results found. No results found for this or any previous visit (from the past 24 hour(s)).  Assessment/Plan: Sheena Brennan is a 52 y.o. female present for OV for  Rash/Urticaria - Exam is consistent with urticaria. IM Depo-Medrol injection provided today. Patient was encouraged to start either Zyrtec or Allegra daily dose. May use Benadryl at night as needed. - Prednisone taper was provided to her to start only if hives are not responding to injection in antihistamines by the weekend. - Encourage her to inquire on the contents of the soup to see if there was a mushroom content. - methylPREDNISolone acetate (DEPO-MEDROL) injection 80 mg - Follow-up when necessary   Reviewed expectations re: course of current medical issues.  Discussed self-management of symptoms.  Outlined signs and symptoms indicating need for more acute intervention.  Patient verbalized understanding and all questions were answered.  Patient received an After-Visit Summary.    No orders of the defined types were placed in this encounter.    Note is dictated utilizing voice recognition software. Although note has been proof read prior to signing, occasional typographical errors still can be missed. If any questions arise, please do not hesitate to call for verification.   electronically signed by:  Howard Pouch, DO  [place]

## 2017-11-01 NOTE — Telephone Encounter (Signed)
  Reason for Disposition . SEVERE itching (i.e., interferes with sleep, normal activities or school)  Answer Assessment - Initial Assessment Questions 1. APPEARANCE of RASH: "Describe the rash." (e.g., spots, blisters, raised areas, skin peeling, scaly)     Looks like mosquito bites 2. SIZE: "How big are the spots?" (e.g., tip of pen, eraser, coin; inches, centimeters)     Eraser 3. LOCATION: "Where is the rash located?"     Legs,arms,trunk and some on face 4. COLOR: "What color is the rash?" (Note: It is difficult to assess rash color in people with darker-colored skin. When this situation occurs, simply ask the caller to describe what they see.)     Red 5. ONSET: "When did the rash begin?"     Yesterday evening 6. FEVER: "Do you have a fever?" If so, ask: "What is your temperature, how was it measured, and when did it start?"     No 7. ITCHING: "Does the rash itch?" If so, ask: "How bad is the itch?" (Scale 1-10; or mild, moderate, severe)     5 8. CAUSE: "What do you think is causing the rash?"     Unsure 9. MEDICATION FACTORS: "Have you started any new medications within the last 2 weeks?" (e.g., antibiotics)      Finished 10. OTHER SYMPTOMS: "Do you have any other symptoms?" (e.g., dizziness, headache, sore throat, joint pain)       No 11. PREGNANCY: "Is there any chance you are pregnant?" "When was your last menstrual period?"       No  Protocols used: RASH OR REDNESS - WIDESPREAD-A-AH States finished antibiotic 2 days ago. Appointment made for today.

## 2018-04-14 ENCOUNTER — Encounter: Payer: 59 | Admitting: Family Medicine

## 2018-04-21 ENCOUNTER — Telehealth: Payer: Self-pay | Admitting: Family Medicine

## 2018-04-21 ENCOUNTER — Ambulatory Visit (INDEPENDENT_AMBULATORY_CARE_PROVIDER_SITE_OTHER): Payer: 59 | Admitting: Family Medicine

## 2018-04-21 ENCOUNTER — Encounter: Payer: Self-pay | Admitting: Family Medicine

## 2018-04-21 VITALS — BP 130/85 | HR 66 | Temp 97.9°F | Resp 18 | Ht 63.0 in | Wt 117.5 lb

## 2018-04-21 DIAGNOSIS — Z130 Encounter for screening for diseases of the blood and blood-forming organs and certain disorders involving the immune mechanism: Secondary | ICD-10-CM

## 2018-04-21 DIAGNOSIS — E7800 Pure hypercholesterolemia, unspecified: Secondary | ICD-10-CM | POA: Diagnosis not present

## 2018-04-21 DIAGNOSIS — M8580 Other specified disorders of bone density and structure, unspecified site: Secondary | ICD-10-CM

## 2018-04-21 DIAGNOSIS — Z0000 Encounter for general adult medical examination without abnormal findings: Secondary | ICD-10-CM

## 2018-04-21 DIAGNOSIS — Z79899 Other long term (current) drug therapy: Secondary | ICD-10-CM | POA: Diagnosis not present

## 2018-04-21 DIAGNOSIS — Z131 Encounter for screening for diabetes mellitus: Secondary | ICD-10-CM | POA: Diagnosis not present

## 2018-04-21 LAB — VITAMIN D 25 HYDROXY (VIT D DEFICIENCY, FRACTURES): VITD: 37.66 ng/mL (ref 30.00–100.00)

## 2018-04-21 LAB — CBC WITH DIFFERENTIAL/PLATELET
BASOS ABS: 0 10*3/uL (ref 0.0–0.1)
Basophils Relative: 0.9 % (ref 0.0–3.0)
Eosinophils Absolute: 0 10*3/uL (ref 0.0–0.7)
Eosinophils Relative: 0.4 % (ref 0.0–5.0)
HCT: 43.8 % (ref 36.0–46.0)
Hemoglobin: 15 g/dL (ref 12.0–15.0)
LYMPHS ABS: 1.6 10*3/uL (ref 0.7–4.0)
Lymphocytes Relative: 37.3 % (ref 12.0–46.0)
MCHC: 34.1 g/dL (ref 30.0–36.0)
MCV: 92.5 fl (ref 78.0–100.0)
MONO ABS: 0.3 10*3/uL (ref 0.1–1.0)
MONOS PCT: 7.6 % (ref 3.0–12.0)
NEUTROS PCT: 53.8 % (ref 43.0–77.0)
Neutro Abs: 2.3 10*3/uL (ref 1.4–7.7)
Platelets: 189 10*3/uL (ref 150.0–400.0)
RBC: 4.74 Mil/uL (ref 3.87–5.11)
RDW: 13.4 % (ref 11.5–15.5)
WBC: 4.2 10*3/uL (ref 4.0–10.5)

## 2018-04-21 LAB — COMPREHENSIVE METABOLIC PANEL
ALK PHOS: 42 U/L (ref 39–117)
ALT: 13 U/L (ref 0–35)
AST: 21 U/L (ref 0–37)
Albumin: 4.2 g/dL (ref 3.5–5.2)
BUN: 18 mg/dL (ref 6–23)
CO2: 31 mEq/L (ref 19–32)
CREATININE: 0.73 mg/dL (ref 0.40–1.20)
Calcium: 9.4 mg/dL (ref 8.4–10.5)
Chloride: 102 mEq/L (ref 96–112)
GFR: 88.16 mL/min (ref >60.00)
GLUCOSE: 84 mg/dL (ref 70–99)
Potassium: 4.2 mEq/L (ref 3.5–5.1)
Sodium: 139 mEq/L (ref 135–145)
TOTAL PROTEIN: 6.6 g/dL (ref 6.0–8.3)
Total Bilirubin: 1 mg/dL (ref 0.2–1.2)

## 2018-04-21 LAB — LIPID PANEL
CHOL/HDL RATIO: 3
Cholesterol: 199 mg/dL (ref 0–200)
HDL: 60.6 mg/dL (ref >39.00)
LDL Cholesterol: 117 mg/dL — ABNORMAL HIGH (ref 0–99)
NonHDL: 137.98
Triglycerides: 103 mg/dL (ref 0.0–149.0)
VLDL: 20.6 mg/dL (ref 0.0–40.0)

## 2018-04-21 LAB — HEMOGLOBIN A1C: Hgb A1c MFr Bld: 5.6 % (ref 4.6–6.5)

## 2018-04-21 NOTE — Progress Notes (Signed)
Patient ID: Sheena Brennan, female  DOB: Jul 26, 1966, 52 y.o.   MRN: [id number] Patient Care Team    Relationship Specialty Notifications Start End  Ma Hillock, DO PCP - General Family Medicine  03/15/16   Richmond Campbell, MD Consulting Physician Gastroenterology  03/19/16   Arvella Nigh, MD Consulting Physician Obstetrics and Gynecology  04/12/17     Chief Complaint  Patient presents with  . Annual Exam    Subjective:  Sheena Brennan is a 52 y.o.  Female  present for CPE. All past medical history, surgical history, allergies, family history, immunizations, medications and social history were updated in the electronic medical record today. All recent labs, ED visits and hospitalizations within the last year were reviewed.  Health maintenance: updated 04/21/18 Colonoscopy: every 5 years for polyps, Dr. Earlean Shawl, UTD--> due 2023. Mammogram:UTD with GYN 06/2016 Cervical cancer screening: UTD with GYN [facility] Immunizations: tdap UTD 2017, yearly flu shot encouraged. Shingrix declined today Infectious disease screening: HIV and Hep C completed DEXA:osteopenia 2015, taking vit D, not calcium. On fosamax.  Assistive device: none Oxygen JGG:EZMO Patient has a Dental home. Hospitalizations/ED visits: none  Depression screen [facility] 2/9 04/21/2018 11/01/2017 10/22/2017 04/12/2017 04/09/2016  Decreased Interest 0 0 0 0 0  Down, Depressed, Hopeless - 0 0 0 0  PHQ - 2 Score 0 0 0 0 0   No flowsheet data found.   Current Exercise Habits: Structured exercise class, Type of exercise: Other - see comments(classes), Time (Minutes): 50, Frequency (Times/Week): 4, Weekly Exercise (Minutes/Week): 200, Intensity: Moderate Exercise limited by: None identified   Immunization History  Administered Date(s) Administered  . Influenza-Unspecified 07/16/2015  . Tdap 04/09/2016     Past Medical History:  Diagnosis Date  . Allergy   . Arthritis   . Colon polyp 2017   adenoma, Dr. Earlean Shawl  . Hemorrhoids      banded multiple times  . Insomnia   . Osteopenia 2015   By DEXA   Allergies  Allergen Reactions  . Demeclocycline Nausea And Vomiting  . Mushroom Extract Complex     Mushrooms- hives  . Tetracyclines & Related Nausea And Vomiting   Past Surgical History:  Procedure Laterality Date  . BUNIONECTOMY Bilateral 2017  . CESAREAN SECTION  1997, 2000  . HEMORRHOID BANDING  03/2016   6th banding  . SEPTOPLASTY  1981   Family History  Problem Relation Age of Onset  . Osteoporosis Mother   . Arthritis Mother   . Breast cancer Maternal Grandmother   . Heart disease Father    Social History   Socioeconomic History  . Marital status: Married    Spouse name: Not on file  . Number of children: Not on file  . Years of education: Not on file  . Highest education level: Not on file  Occupational History  . Not on file  Social Needs  . Financial resource strain: Not on file  . Food insecurity:    Worry: Not on file    Inability: Not on file  . Transportation needs:    Medical: Not on file    Non-medical: Not on file  Tobacco Use  . Smoking status: Never Smoker  . Smokeless tobacco: Never Used  Substance and Sexual Activity  . Alcohol use: No    Alcohol/week: 0.0 oz  . Drug use: No  . Sexual activity: Yes    Partners: Male    Birth control/protection: Post-menopausal    Comment: Married  Lifestyle  .  Physical activity:    Days per week: Not on file    Minutes per session: Not on file  . Stress: Not on file  Relationships  . Social connections:    Talks on phone: Not on file    Gets together: Not on file    Attends religious service: Not on file    Active member of club or organization: Not on file    Attends meetings of clubs or organizations: Not on file    Relationship status: Not on file  . Intimate partner violence:    Fear of current or ex partner: Not on file    Emotionally abused: Not on file    Physically abused: Not on file    Forced sexual activity:  Not on file  Other Topics Concern  . Not on file  Social History Narrative   Married to [place]. 2 children (eric and brian)   Bachelors degree, Optometrist, employed.   Denies tobacco, drug or alcohol use.   Drinks caffeinated beverages.   Takes a daily vitamin.   Exercise routinely.   Wears her seatbelt, smoke detector in the home   Firearms in a locked cabinet In the home   Feels safe in her relationships.   Allergies as of 04/21/2018      Reactions   Demeclocycline Nausea And Vomiting   Mushroom Extract Complex    Mushrooms- hives   Tetracyclines & Related Nausea And Vomiting      Medication List        Accurate as of 04/21/18  8:44 AM. Always use your most recent med list.          alendronate 70 MG tablet Commonly known as:  FOSAMAX Take 70 mg by mouth once a week.   cholecalciferol 1000 units tablet Commonly known as:  VITAMIN D Take 2,000 Units by mouth daily.   zolpidem 10 MG tablet Commonly known as:  AMBIEN Take 10 mg by mouth at bedtime as needed.       All past medical history, surgical history, allergies, family history, immunizations andmedications were updated in the EMR today and reviewed under the history and medication portions of their EMR.     No results found for this or any previous visit (from the past 2160 hour(s)).  No results found.   ROS: 14 pt review of systems performed and negative (unless mentioned in an HPI)  Objective: BP 130/85 (BP Location: Left Arm, Patient Position: Sitting, Cuff Size: Normal)   Pulse 66   Temp 97.9 F (36.6 C)   Resp 18   Ht '5\' 3"'$  (1.6 m)   Wt 117 lb 8 oz (53.3 kg)   SpO2 100%   BMI 20.81 kg/m  Gen: Afebrile. No acute distress. Nontoxic in appearance, well-developed, well-nourished,  Pleasant caucasian female.  HENT: AT. [place]. Bilateral TM visualized and normal in appearance, normal external auditory canal. MMM, no oral lesions, adequate dentition. Bilateral nares within normal limits. Throat without  erythema, ulcerations or exudates. no Cough on exam, no hoarseness on exam. Eyes:Pupils Equal Round Reactive to light, Extraocular movements intact,  Conjunctiva without redness, discharge or icterus. Neck/lymp/endocrine: Supple,no lymphadenopathy, no thyromegaly CV: RRR no murmur, no edema, +2/4 P posterior tibialis pulses. no carotid bruits. No JVD. Chest: CTAB, no wheeze, rhonchi or crackles. normal Respiratory effort. good Air movement. Abd: Soft. flat. NTND. BS present. no Masses palpated. No hepatosplenomegaly. No rebound tenderness or guarding. Skin: no rashes, purpura or petechiae. Warm and well-perfused. Skin intact. Neuro/Msk:  Normal gait. PERLA. EOMi. Alert. Oriented x3.  Cranial nerves II through XII intact. Muscle strength 5/5 upper/lower extremity. DTRs equal bilaterally. Psych: Normal affect, dress and demeanor. Normal speech. Normal thought content and judgment.  No exam data present  Assessment/plan: Sheena Brennan is a 52 y.o. female present for CPE. Encounter for preventive health examination Patient was encouraged to exercise greater than 150 minutes a week. Patient was encouraged to choose a diet filled with fresh fruits and vegetables, and lean meats. AVS provided to patient today for education/recommendation on gender specific health and safety maintenance. Colonoscopy: every 5 years for polyps, Dr. Earlean Shawl, UTD--> due 2023. Mammogram:UTD with GYN 06/2016 Cervical cancer screening: UTD with GYN [facility] Immunizations: tdap UTD 2017, yearly flu shot encouraged. Shingrix declined today Infectious disease screening: HIV and Hep C completed DEXA:osteopenia 2015, taking vit D, not calcium. On fosamax.  Osteopenia, unspecified location On fosamax  Diabetes mellitus screening - HgB A1c Elevated LDL cholesterol level - Lipid panel  Encounter for long-term (current) use of medications - Comp Met (CMET) - Vitamin D (25 hydroxy) Screening for deficiency anemia - CBC  w/Diff   Return in about 1 year (around 04/22/2019) for CPE.  Electronically signed by: Howard Pouch, DO [place]

## 2018-04-21 NOTE — Telephone Encounter (Signed)
Please inform patient the following information: Her labs look good. Cholesterol is well controlled.  Her diabetes screen jumped up to 5.6 from 5.1---> but this is still normal. Recommend watching diet, lower sugar/carb and increase exercise to prevent from developing diabetes.

## 2018-04-21 NOTE — Patient Instructions (Signed)

## 2018-04-22 NOTE — Telephone Encounter (Signed)
Patient notified and verbalized understanding. 

## 2018-06-17 ENCOUNTER — Ambulatory Visit: Payer: Self-pay | Admitting: Family Medicine

## 2018-06-17 ENCOUNTER — Telehealth: Payer: 59 | Admitting: Family Medicine

## 2018-06-17 DIAGNOSIS — N3000 Acute cystitis without hematuria: Secondary | ICD-10-CM

## 2018-06-17 MED ORDER — CEPHALEXIN 500 MG PO CAPS: 500.0000 mg | ORAL_CAPSULE | Freq: Two times a day (BID) | ORAL | 0 refills | Status: AC

## 2018-06-17 NOTE — Progress Notes (Signed)

## 2018-06-17 NOTE — Telephone Encounter (Signed)
Returned call to patient who voiced S/S of UTI (frequency pressure).  Pt rates the discomfort as severe. Pt refused Dr appointment and questioned e-visit stating that insurance would pay and office visit would cost more. Pt helped to access my Chart e-visit.  Care advice read to patient Pt verbalized understanding of all instructions.  Reason for Disposition . Urinating more frequently than usual (i.e., frequency)  Answer Assessment - Initial Assessment Questions 1. SYMPTOM: "What's the main symptom you're concerned about?" (e.g., frequency, incontinence)     Frequency pressure 2. ONSET: "When did the  symptom start?"     today 3. PAIN: "Is there any pain?" If so, ask: "How bad is it?" (Scale: 1-10; mild, moderate, severe)     severe 4. CAUSE: "What do you think is causing the symptoms?"     UTI 5. OTHER SYMPTOMS: "Do you have any other symptoms?" (e.g., fever, flank pain, blood in urine, pain with urination)     no  Protocols used: URINARY [facility]

## 2018-09-10 DIAGNOSIS — K645 Perianal venous thrombosis: Secondary | ICD-10-CM | POA: Diagnosis not present

## 2018-09-18 ENCOUNTER — Encounter: Payer: Self-pay | Admitting: Family Medicine

## 2018-09-18 DIAGNOSIS — Z01419 Encounter for gynecological examination (general) (routine) without abnormal findings: Secondary | ICD-10-CM | POA: Diagnosis not present

## 2018-09-18 DIAGNOSIS — Z6820 Body mass index (BMI) 20.0-20.9, adult: Secondary | ICD-10-CM | POA: Diagnosis not present

## 2018-09-18 DIAGNOSIS — Z808 Family history of malignant neoplasm of other organs or systems: Secondary | ICD-10-CM | POA: Diagnosis not present

## 2018-09-18 DIAGNOSIS — Z86010 Personal history of colonic polyps: Secondary | ICD-10-CM | POA: Diagnosis not present

## 2018-09-18 DIAGNOSIS — Z803 Family history of malignant neoplasm of breast: Secondary | ICD-10-CM | POA: Diagnosis not present

## 2019-07-06 ENCOUNTER — Telehealth: Payer: Self-pay

## 2019-07-06 ENCOUNTER — Encounter: Payer: Self-pay | Admitting: Family Medicine

## 2019-07-06 ENCOUNTER — Other Ambulatory Visit: Payer: Self-pay

## 2019-07-06 ENCOUNTER — Ambulatory Visit (INDEPENDENT_AMBULATORY_CARE_PROVIDER_SITE_OTHER): Payer: 59 | Admitting: Family Medicine

## 2019-07-06 VITALS — BP 127/78 | HR 60 | Temp 97.6°F | Resp 16 | Ht 62.0 in | Wt 117.4 lb

## 2019-07-06 DIAGNOSIS — G4700 Insomnia, unspecified: Secondary | ICD-10-CM

## 2019-07-06 DIAGNOSIS — Z79899 Other long term (current) drug therapy: Secondary | ICD-10-CM | POA: Diagnosis not present

## 2019-07-06 DIAGNOSIS — M8580 Other specified disorders of bone density and structure, unspecified site: Secondary | ICD-10-CM | POA: Diagnosis not present

## 2019-07-06 DIAGNOSIS — Z13220 Encounter for screening for lipoid disorders: Secondary | ICD-10-CM

## 2019-07-06 DIAGNOSIS — Z131 Encounter for screening for diabetes mellitus: Secondary | ICD-10-CM | POA: Diagnosis not present

## 2019-07-06 DIAGNOSIS — R319 Hematuria, unspecified: Secondary | ICD-10-CM

## 2019-07-06 DIAGNOSIS — Z130 Encounter for screening for diseases of the blood and blood-forming organs and certain disorders involving the immune mechanism: Secondary | ICD-10-CM

## 2019-07-06 LAB — LIPID PANEL
Cholesterol: 224 mg/dL — ABNORMAL HIGH (ref 0–200)
HDL: 62.8 mg/dL (ref >39.00)
LDL Cholesterol: 147 mg/dL — ABNORMAL HIGH (ref 0–99)
NonHDL: 161.4
Total CHOL/HDL Ratio: 4
Triglycerides: 74 mg/dL (ref 0.0–149.0)
VLDL: 14.8 mg/dL (ref 0.0–40.0)

## 2019-07-06 LAB — COMPREHENSIVE METABOLIC PANEL
ALT: 13 U/L (ref 0–35)
AST: 19 U/L (ref 0–37)
Albumin: 4.4 g/dL (ref 3.5–5.2)
Alkaline Phosphatase: 43 U/L (ref 39–117)
BUN: 20 mg/dL (ref 6–23)
CO2: 30 mEq/L (ref 19–32)
Calcium: 9.7 mg/dL (ref 8.4–10.5)
Chloride: 104 mEq/L (ref 96–112)
Creatinine, Ser: 0.75 mg/dL (ref 0.40–1.20)
GFR: 80.04 mL/min (ref >60.00)
Glucose, Bld: 79 mg/dL (ref 70–99)
Potassium: 3.9 mEq/L (ref 3.5–5.1)
Sodium: 139 mEq/L (ref 135–145)
Total Bilirubin: 1.2 mg/dL (ref 0.2–1.2)
Total Protein: 6.9 g/dL (ref 6.0–8.3)

## 2019-07-06 LAB — CBC
HCT: 42.4 % (ref 36.0–46.0)
Hemoglobin: 14.3 g/dL (ref 12.0–15.0)
MCHC: 33.8 g/dL (ref 30.0–36.0)
MCV: 91.8 fl (ref 78.0–100.0)
Platelets: 182 10*3/uL (ref 150.0–400.0)
RBC: 4.62 Mil/uL (ref 3.87–5.11)
RDW: 13.9 % (ref 11.5–15.5)
WBC: 3.9 10*3/uL — ABNORMAL LOW (ref 4.0–10.5)

## 2019-07-06 LAB — TSH: TSH: 2.27 u[IU]/mL (ref 0.35–4.50)

## 2019-07-06 LAB — HEMOGLOBIN A1C: Hgb A1c MFr Bld: 5.5 % (ref 4.6–6.5)

## 2019-07-06 NOTE — Patient Instructions (Signed)
Health Maintenance, Female Adopting a healthy lifestyle and getting preventive care are important in promoting health and wellness. Ask your health care provider about:  The right schedule for you to have regular tests and exams.  Things you can do on your own to prevent diseases and keep yourself healthy. What should I know about diet, weight, and exercise? Eat a healthy diet   Eat a diet that includes plenty of vegetables, fruits, low-fat dairy products, and lean protein.  Do not eat a lot of foods that are high in solid fats, added sugars, or sodium. Maintain a healthy weight Body mass index (BMI) is used to identify weight problems. It estimates body fat based on height and weight. Your health care provider can help determine your BMI and help you achieve or maintain a healthy weight. Get regular exercise Get regular exercise. This is one of the most important things you can do for your health. Most adults should:  Exercise for at least 150 minutes each week. The exercise should increase your heart rate and make you sweat (moderate-intensity exercise).  Do strengthening exercises at least twice a week. This is in addition to the moderate-intensity exercise.  Spend less time sitting. Even light physical activity can be beneficial. Watch cholesterol and blood lipids Have your blood tested for lipids and cholesterol at 55 years of age, then have this test every 5 years. Have your cholesterol levels checked more often if:  Your lipid or cholesterol levels are high.  You are older than 55 years of age.  You are at high risk for heart disease. What should I know about cancer screening? Depending on your health history and family history, you may need to have cancer screening at various ages. This may include screening for:  Breast cancer.  Cervical cancer.  Colorectal cancer.  Skin cancer.  Lung cancer. What should I know about heart disease, diabetes, and high blood  pressure? Blood pressure and heart disease  High blood pressure causes heart disease and increases the risk of stroke. This is more likely to develop in people who have high blood pressure readings, are of African descent, or are overweight.  Have your blood pressure checked: ? Every 3-5 years if you are 18-39 years of age. ? Every year if you are 40 years old or older. Diabetes Have regular diabetes screenings. This checks your fasting blood sugar level. Have the screening done:  Once every three years after age 40 if you are at a normal weight and have a low risk for diabetes.  More often and at a younger age if you are overweight or have a high risk for diabetes. What should I know about preventing infection? Hepatitis B If you have a higher risk for hepatitis B, you should be screened for this virus. Talk with your health care provider to find out if you are at risk for hepatitis B infection. Hepatitis C Testing is recommended for:  Everyone born from 1945 through 1965.  Anyone with known risk factors for hepatitis C. Sexually transmitted infections (STIs)  Get screened for STIs, including gonorrhea and chlamydia, if: ? You are sexually active and are younger than 55 years of age. ? You are older than 55 years of age and your health care provider tells you that you are at risk for this type of infection. ? Your sexual activity has changed since you were last screened, and you are at increased risk for chlamydia or gonorrhea. Ask your health care provider if   you are at risk.  Ask your health care provider about whether you are at high risk for HIV. Your health care provider may recommend a prescription medicine to help prevent HIV infection. If you choose to take medicine to prevent HIV, you should first get tested for HIV. You should then be tested every 3 months for as long as you are taking the medicine. Pregnancy  If you are about to stop having your period (premenopausal) and  you may become pregnant, seek counseling before you get pregnant.  Take 400 to 800 micrograms (mcg) of folic acid every day if you become pregnant.  Ask for birth control (contraception) if you want to prevent pregnancy. Osteoporosis and menopause Osteoporosis is a disease in which the bones lose minerals and strength with aging. This can result in bone fractures. If you are 65 years old or older, or if you are at risk for osteoporosis and fractures, ask your health care provider if you should:  Be screened for bone loss.  Take a calcium or vitamin D supplement to lower your risk of fractures.  Be given hormone replacement therapy (HRT) to treat symptoms of menopause. Follow these instructions at home: Lifestyle  Do not use any products that contain nicotine or tobacco, such as cigarettes, e-cigarettes, and chewing tobacco. If you need help quitting, ask your health care provider.  Do not use street drugs.  Do not share needles.  Ask your health care provider for help if you need support or information about quitting drugs. Alcohol use  Do not drink alcohol if: ? Your health care provider tells you not to drink. ? You are pregnant, may be pregnant, or are planning to become pregnant.  If you drink alcohol: ? Limit how much you use to 0-1 drink a day. ? Limit intake if you are breastfeeding.  Be aware of how much alcohol is in your drink. In the [place] one drink equals one 12 oz bottle of beer (355 mL), one 5 oz glass of wine (148 mL), or one 1 oz glass of hard liquor (44 mL). General instructions  Schedule regular health, dental, and eye exams.  Stay current with your vaccines.  Tell your health care provider if: ? You often feel depressed. ? You have ever been abused or do not feel safe at home. Summary  Adopting a healthy lifestyle and getting preventive care are important in promoting health and wellness.  Follow your health care provider's instructions about healthy  diet, exercising, and getting tested or screened for diseases.  Follow your health care provider's instructions on monitoring your cholesterol and blood pressure. This information is not intended to replace advice given to you by your health care provider. Make sure you discuss any questions you have with your health care provider. Document Released: 04/16/2011 Document Revised: 09/24/2018 Document Reviewed: 09/24/2018 Elsevier Patient Education  2020 Elsevier Inc.  

## 2019-07-06 NOTE — Telephone Encounter (Signed)
Sheena Brennan Physician results form. Will complete once labs return. Pt requests forms to be faxed to number on form once complete. On Atavia Poppe's desk.

## 2019-07-06 NOTE — Progress Notes (Signed)
Patient ID: Sheena Brennan, female  DOB: 31-Oct-1965, 53 y.o.   MRN: [id number] Patient Care Team    Relationship Specialty Notifications Start End  Ma Hillock, DO PCP - General Family Medicine  03/15/16   Richmond Campbell, MD Consulting Physician Gastroenterology  03/19/16   Arvella Nigh, MD Consulting Physician Obstetrics and Gynecology  04/12/17     Chief Complaint  Patient presents with  . Annual Exam    pt is fasting    Subjective:  Sheena Brennan is a 53 y.o.  Female  present for CPE . All past medical history, surgical history, allergies, family history, immunizations, medications and social history were updated in the electronic medical record today. All recent labs, ED visits and hospitalizations within the last year were reviewed.   Health maintenance:  Colonoscopy: every 5 years for polyps, Dr. Earlean Shawl, UTD--> [id number]/7902. Mammogram:UTD with GYN9/2017>>scheduled Nov at GYN Cervical cancer screening: UTD with [facility]>> scheduled GYN Immunizations: tdapUTD 2017, yearly flu shot encouraged. Shingrix declined today Infectious disease screening: HIV and Hep Ccompleted DEXA:osteopenia (-2.0) 2015, taking vit D, not calcium. On fosamax>>scheduled GYN Assistive device: none Oxygen IOX:BDZH Patient has a Dental home. Hospitalizations/ED visits: reviewed   Depression screen [facility] 2/9 07/06/2019 04/21/2018 11/01/2017 10/22/2017 04/12/2017  Decreased Interest 0 0 0 0 0  Down, Depressed, Hopeless 0 - 0 0 0  PHQ - 2 Score 0 0 0 0 0   No flowsheet data found.   Immunization History  Administered Date(s) Administered  . Influenza-Unspecified 07/16/2015  . Tdap 04/09/2016    Past Medical History:  Diagnosis Date  . Allergy   . Arthritis   . Colon polyp 2017   adenoma, Dr. Earlean Shawl  . Hemorrhoids    banded multiple times  . Insomnia   . Osteopenia 2015   By DEXA   Allergies  Allergen Reactions  . Demeclocycline Nausea And Vomiting  . Mushroom Extract Complex    Mushrooms- hives  . Tetracyclines & Related Nausea And Vomiting   Past Surgical History:  Procedure Laterality Date  . BUNIONECTOMY Bilateral 2017  . CESAREAN SECTION  1997, 2000  . HEMORRHOID BANDING  03/2016   6th banding  . SEPTOPLASTY  1981   Family History  Problem Relation Age of Onset  . Osteoporosis Mother   . Arthritis Mother   . Breast cancer Maternal Grandmother   . Heart disease Father    Social History   Social History Narrative   Married to [place]. 2 children (eric and brian)   Bachelors degree, Optometrist, employed.   Denies tobacco, drug or alcohol use.   Drinks caffeinated beverages.   Takes a daily vitamin.   Exercise routinely.   Wears her seatbelt, smoke detector in the home   Firearms in a locked cabinet In the home   Feels safe in her relationships.    Allergies as of 07/06/2019      Reactions   Demeclocycline Nausea And Vomiting   Mushroom Extract Complex    Mushrooms- hives   Tetracyclines & Related Nausea And Vomiting      Medication List       Accurate as of July 06, 2019  8:30 AM. If you have any questions, ask your nurse or doctor.        alendronate 70 MG tablet Commonly known as: FOSAMAX Take 70 mg by mouth once a week.   cholecalciferol 25 MCG (1000 UT) tablet Commonly known as: VITAMIN D Take 2,000 Units by mouth daily.  zolpidem 10 MG tablet Commonly known as: AMBIEN Take 10 mg by mouth at bedtime as needed.       All past medical history, surgical history, allergies, family history, immunizations andmedications were updated in the EMR today and reviewed under the history and medication portions of their EMR.     No results found for this or any previous visit (from the past 2160 hour(s)).  No results found.   ROS: 14 pt review of systems performed and negative (unless mentioned in an HPI)  Objective: BP 127/78 (BP Location: Left Arm, Patient Position: Sitting, Cuff Size: Normal)   Pulse 60   Temp 97.6 F  (36.4 C) (Temporal)   Resp 16   Ht _0  (1.575 m)   Wt 117 lb 6.4 oz (53.3 kg)   SpO2 100%   BMI 21.47 kg/m  Gen: Afebrile. No acute distress. Nontoxic in appearance, well-developed, well-nourished, female HENT: AT. [place]. Bilateral TM visualized and normal in appearance, normal external auditory canal. MMM, no oral lesions, adequate dentition. Bilateral nares within normal limits. Throat without erythema, ulcerations or exudates. no Cough on exam, no hoarseness on exam. Eyes:Pupils Equal Round Reactive to light, Extraocular movements intact,  Conjunctiva without redness, discharge or icterus. Neck/lymp/endocrine: Supple,no lymphadenopathy, no thyromegaly CV: RRR no murmur, no edema, +2/4 P posterior tibialis pulses. no carotid bruits. No JVD. Chest: CTAB, no wheeze, rhonchi or crackles. normal Respiratory effort. good Air movement. Abd: Soft. flat. NTND. BS present. no Masses palpated. No hepatosplenomegaly. No rebound tenderness or guarding. Skin: no rashes, purpura or petechiae. Warm and well-perfused. Skin intact. Neuro/Msk:  Normal gait. PERLA. EOMi. Alert. Oriented x3.  Cranial nerves II through XII intact. Muscle strength 5/5 upper/lower extremity. DTRs equal bilaterally. Psych: Normal affect, dress and demeanor. Normal speech. Normal thought content and judgment.   No exam data present  Assessment/plan: Sheena Brennan is a 53 y.o. female present for CPE  Encounter for long-term (current) use of medications - Comp Met (CMET) Insomnia, unspecified type Prescribed ambien by gyn - TSH Osteopenia, unspecified location - Vitamin D (25 hydroxy) - prescribed fosamax by GYN Screening for deficiency anemia - CBC Screening cholesterol level - Lipid panel Diabetes mellitus screening - Hemoglobin A1c Hematuria, unspecified type Usually trace- followed by gyn Encounter for preventive health exam:  Patient was encouraged to exercise greater than 150 minutes a week. Patient was  encouraged to choose a diet filled with fresh fruits and vegetables, and lean meats. AVS provided to patient today for education/recommendation on gender specific health and safety maintenance. Colonoscopy: every 5 years for polyps, Dr. Earlean Shawl, UTD--> [id number]. Mammogram:UTD with GYN9/2017>>scheduled Nov at GYN Cervical cancer screening: UTD with [facility]>> scheduled GYN Immunizations: tdapUTD 2017, yearly flu shot encouraged. Shingrix declined today Infectious disease screening: HIV and Hep Ccompleted DEXA:osteopenia (-2.0) 2015, taking vit D, not calcium. On fosamax>>scheduled GYN   Return in about 1 year (around 07/05/2020) for CPE (30 min).  Electronically signed by: Howard Pouch, DO [place]

## 2019-07-07 ENCOUNTER — Telehealth: Payer: Self-pay | Admitting: Family Medicine

## 2019-07-07 LAB — VITAMIN D 25 HYDROXY (VIT D DEFICIENCY, FRACTURES): VITD: 36.9 ng/mL (ref 30.00–100.00)

## 2019-07-07 NOTE — Telephone Encounter (Signed)
Please inform patient the following information: Her labs look excellent overall.  - Vit d is in normal range, thyroid is functioning normal. Blood counts, liver and kidney function is normal.   - Her LDL, which is the "bad" cholesterol, is higher this year than in the past 117> 147.  -Focusing on keeping up with at least 150 min exercise a week or more. Healthy diet.  A mediterranean diet is high in fruits, vegetables, whole grains, fish, chicken, nuts, healthy fats (olive oil or canola oil). Low fat dairy. Limit butter, margarine, red meat and sweets. There are many online resources and books on this diet.  Ideally, < 130 LDL is a good goal for her. No medications recommend at this time. Thanks!

## 2019-07-07 NOTE — Telephone Encounter (Signed)
Pt was called and given lab results, she verbalized understanding.  

## 2019-07-08 NOTE — Telephone Encounter (Signed)
Completed and faxed form, sent to scan

## 2019-07-08 NOTE — Telephone Encounter (Signed)
Faxed health form and sent to scan

## 2019-07-14 ENCOUNTER — Telehealth: Payer: Self-pay | Admitting: Family Medicine

## 2019-07-14 ENCOUNTER — Telehealth: Payer: 59 | Admitting: Physician Assistant

## 2019-07-14 DIAGNOSIS — R399 Unspecified symptoms and signs involving the genitourinary system: Secondary | ICD-10-CM

## 2019-07-14 MED ORDER — CEPHALEXIN 500 MG PO CAPS: 500.0000 mg | ORAL_CAPSULE | Freq: Two times a day (BID) | ORAL | 0 refills | Status: AC

## 2019-07-14 NOTE — Telephone Encounter (Signed)
Patient prone to UTI. She is having symptoms of frequent urination and lots of pressure. She is out of town and requested for someone to possibly call in meds for her.   Please call [phone]

## 2019-07-14 NOTE — Progress Notes (Signed)

## 2019-07-14 NOTE — Telephone Encounter (Signed)
Patient advised to try E-visit, agreed with plan.

## 2019-07-17 ENCOUNTER — Telehealth: Payer: Self-pay | Admitting: Family Medicine

## 2019-07-17 NOTE — Telephone Encounter (Signed)
Date placed on form and faxed w/ confirmation.  Patient aware.

## 2019-07-17 NOTE — Telephone Encounter (Signed)
Patient called regarding CPE Insurance form that was completed by Dr. Raoul Pitch for patient's employer.  Employer was in contact with patient today regarding incomplete form that was faxed to them on 07/08/19.  They told her "Date Test(s) Performed" was not filled in with date. Therefore, her form is incomplete. I printed the form, the date is omitted. Sending to [facility] clinical team to verify date of test, fill in date and fax form. Please call patient at [phone] when form is sent.  Thank you

## 2019-09-01 LAB — HM MAMMOGRAPHY

## 2019-09-01 LAB — CBC AND DIFFERENTIAL: Hemoglobin: 14.4 (ref 12.0–16.0)

## 2020-02-13 DIAGNOSIS — U071 COVID-19: Secondary | ICD-10-CM

## 2020-02-13 HISTORY — DX: COVID-19: U07.1

## 2020-02-26 ENCOUNTER — Telehealth: Payer: Self-pay

## 2020-02-26 NOTE — Telephone Encounter (Signed)
Husband tested positive for COVID, she did not.  They both have same exact symptoms. Fever 100.3 and above, coughing, etc At what point, at any, if any, do you start to treat symptoms  Please advise. Call patient  [phone]

## 2020-02-26 NOTE — Telephone Encounter (Signed)
Pt was called and encouraged to take OTC meds that she has been told before she can take for cold/flu/headaches. Denied SOB. Offered VV and she refused and said she would wait to see how she felt on Monday. She has not been tested and feels it is not needed. Isolation protocol reviewed with patient. Advised to go straight to ED if she experiences SOB.

## 2020-04-05 ENCOUNTER — Ambulatory Visit (INDEPENDENT_AMBULATORY_CARE_PROVIDER_SITE_OTHER): Payer: 59 | Admitting: Family Medicine

## 2020-04-05 ENCOUNTER — Encounter: Payer: Self-pay | Admitting: Family Medicine

## 2020-04-05 ENCOUNTER — Other Ambulatory Visit: Payer: Self-pay

## 2020-04-05 VITALS — BP 118/82 | HR 62 | Temp 98.1°F | Resp 16 | Ht 61.25 in | Wt 113.5 lb

## 2020-04-05 DIAGNOSIS — E7800 Pure hypercholesterolemia, unspecified: Secondary | ICD-10-CM

## 2020-04-05 DIAGNOSIS — Z0000 Encounter for general adult medical examination without abnormal findings: Secondary | ICD-10-CM | POA: Diagnosis not present

## 2020-04-05 DIAGNOSIS — Z131 Encounter for screening for diabetes mellitus: Secondary | ICD-10-CM

## 2020-04-05 DIAGNOSIS — M8580 Other specified disorders of bone density and structure, unspecified site: Secondary | ICD-10-CM | POA: Diagnosis not present

## 2020-04-05 LAB — COMPREHENSIVE METABOLIC PANEL
ALT: 10 U/L (ref 0–35)
AST: 18 U/L (ref 0–37)
Albumin: 4.2 g/dL (ref 3.5–5.2)
Alkaline Phosphatase: 43 U/L (ref 39–117)
BUN: 18 mg/dL (ref 6–23)
CO2: 31 mEq/L (ref 19–32)
Calcium: 9.3 mg/dL (ref 8.4–10.5)
Chloride: 104 mEq/L (ref 96–112)
Creatinine, Ser: 0.73 mg/dL (ref 0.40–1.20)
GFR: 82.35 mL/min (ref >60.00)
Glucose, Bld: 88 mg/dL (ref 70–99)
Potassium: 3.9 mEq/L (ref 3.5–5.1)
Sodium: 140 mEq/L (ref 135–145)
Total Bilirubin: 0.8 mg/dL (ref 0.2–1.2)
Total Protein: 6.5 g/dL (ref 6.0–8.3)

## 2020-04-05 LAB — VITAMIN D 25 HYDROXY (VIT D DEFICIENCY, FRACTURES): VITD: 42.75 ng/mL (ref 30.00–100.00)

## 2020-04-05 LAB — LIPID PANEL
Cholesterol: 196 mg/dL (ref 0–200)
HDL: 54.9 mg/dL (ref >39.00)
LDL Cholesterol: 123 mg/dL — ABNORMAL HIGH (ref 0–99)
NonHDL: 140.69
Total CHOL/HDL Ratio: 4
Triglycerides: 88 mg/dL (ref 0.0–149.0)
VLDL: 17.6 mg/dL (ref 0.0–40.0)

## 2020-04-05 LAB — CBC
HCT: 39.1 % (ref 36.0–46.0)
Hemoglobin: 13.5 g/dL (ref 12.0–15.0)
MCHC: 34.5 g/dL (ref 30.0–36.0)
MCV: 92 fl (ref 78.0–100.0)
Platelets: 176 10*3/uL (ref 150.0–400.0)
RBC: 4.25 Mil/uL (ref 3.87–5.11)
RDW: 14.1 % (ref 11.5–15.5)
WBC: 4.7 10*3/uL (ref 4.0–10.5)

## 2020-04-05 LAB — TSH: TSH: 2.36 u[IU]/mL (ref 0.35–4.50)

## 2020-04-05 LAB — HEMOGLOBIN A1C: Hgb A1c MFr Bld: 5.4 % (ref 4.6–6.5)

## 2020-04-05 NOTE — Progress Notes (Signed)
This visit occurred during the SARS-CoV-2 public health emergency.  Safety protocols were in place, including screening questions prior to the visit, additional usage of staff PPE, and extensive cleaning of exam room while observing appropriate contact time as indicated for disinfecting solutions.    Patient ID: Sheena Brennan, female  DOB: 1965/10/30, 54 y.o.   MRN: [id number] Patient Care Team    Relationship Specialty Notifications Start End  Ma Hillock, DO PCP - General Family Medicine  03/15/16   Richmond Campbell, MD Consulting Physician Gastroenterology  03/19/16   Arvella Nigh, MD Consulting Physician Obstetrics and Gynecology  04/12/17     Chief Complaint  Patient presents with  . Annual Exam    Fasting. Pap smear and mammogram 08/31/2019. Requested records.     Subjective:  Sheena Brennan is a 54 y.o.  Female  present for CPE. All past medical history, surgical history, allergies, family history, immunizations, medications and social history were updated in the electronic medical record today. All recent labs, ED visits and hospitalizations within the last year were reviewed.  Health maintenance:  Colonoscopy: every 5 years for polyps, Dr. Earlean Shawl, UTD--> [id number]. Mammogram:UTD with [id number] Cervical cancer screening: UTD with [facility] Immunizations: tdapUTD 2017, yearly flu shot encouraged.Shingrix declined. Covid declined for now. Counseled.  Infectious disease screening: HIV and Hep Ccompleted DEXA:osteopenia (-2.0) 2015, taking vit D, not calcium. fosamax>>scheduled GYN Assistive device: none Oxygen ZLD:JTTS Patient has a Dental home. Hospitalizations/ED visits: reviewed   Depression screen [facility] 2/9 04/05/2020 07/06/2019 04/21/2018 11/01/2017 10/22/2017  Decreased Interest 0 0 0 0 0  Down, Depressed, Hopeless 0 0 - 0 0  PHQ - 2 Score 0 0 0 0 0   No flowsheet data found.   Immunization History  Administered Date(s) Administered  . Influenza-Unspecified  07/16/2015, 07/15/2018  . Tdap 04/09/2016   Past Medical History:  Diagnosis Date  . Allergy   . Arthritis   . Colon polyp 2017   adenoma, Dr. Earlean Shawl  . COVID-19 02/2020   mild flu like symptoms.   . Hemorrhoids    banded multiple times  . Insomnia   . Osteopenia 2015   By DEXA   Allergies  Allergen Reactions  . Demeclocycline Nausea And Vomiting  . Mushroom Extract Complex     Mushrooms- hives  . Tetracyclines & Related Nausea And Vomiting   Past Surgical History:  Procedure Laterality Date  . BUNIONECTOMY Bilateral 2017  . CESAREAN SECTION  1997, 2000  . HEMORRHOID BANDING  03/2016   6th banding  . SEPTOPLASTY  1981   Family History  Problem Relation Age of Onset  . Osteoporosis Mother   . Arthritis Mother   . Breast cancer Maternal Grandmother   . Heart disease Father    Social History   Social History Narrative   Married to [employer]. 2 children (eric and brian)   Bachelors degree, Optometrist, employed.   Denies tobacco, drug or alcohol use.   Drinks caffeinated beverages.   Takes a daily vitamin.   Exercise routinely.   Wears her seatbelt, smoke detector in the home   Firearms in a locked cabinet In the home   Feels safe in her relationships.    Allergies as of 04/05/2020      Reactions   Demeclocycline Nausea And Vomiting   Mushroom Extract Complex    Mushrooms- hives   Tetracyclines & Related Nausea And Vomiting      Medication List       Accurate as  of April 05, 2020  8:56 AM. If you have any questions, ask your nurse or doctor.        alendronate 70 MG tablet Commonly known as: FOSAMAX Take 70 mg by mouth once a week.   cholecalciferol 25 MCG (1000 UNIT) tablet Commonly known as: VITAMIN D Take 2,000 Units by mouth daily.   zolpidem 10 MG tablet Commonly known as: AMBIEN Take 10 mg by mouth at bedtime as needed.       All past medical history, surgical history, allergies, family history, immunizations andmedications were updated in  the EMR today and reviewed under the history and medication portions of their EMR.     No results found for this or any previous visit (from the past 2160 hour(s)).  No results found.   ROS: 14 pt review of systems performed and negative (unless mentioned in an HPI)  Objective: BP 118/82 (BP Location: Left Arm, Patient Position: Sitting, Cuff Size: Normal)   Pulse 62   Temp 98.1 F (36.7 C) (Temporal)   Resp 16   Ht 5' 1.25" (1.556 m)   Wt 113 lb 8 oz (51.5 kg)   SpO2 99%   BMI 21.27 kg/m  Gen: Afebrile. No acute distress. Nontoxic in appearance, well-developed, well-nourished,  Female.  HENT: AT. [place]. Bilateral TM visualized and normal in appearance, normal external auditory canal. MMM, no oral lesions, adequate dentition. Bilateral nares within normal limits. Throat without erythema, ulcerations or exudates. no Cough on exam, no hoarseness on exam. Eyes:Pupils Equal Round Reactive to light, Extraocular movements intact,  Conjunctiva without redness, discharge or icterus. Neck/lymp/endocrine: Supple,no lymphadenopathy, no thyromegaly CV: RRR no murmur, no edema, +2/4 P posterior tibialis pulses.  Chest: CTAB, no wheeze, rhonchi or crackles. normal Respiratory effort. good Air movement. Abd: Soft. falt. NTND. BS present. no Masses palpated. No hepatosplenomegaly. No rebound tenderness or guarding. Skin: no rashes, purpura or petechiae. Warm and well-perfused. Skin intact. Neuro/Msk:  Normal gait. PERLA. EOMi. Alert. Oriented x3.  Cranial nerves II through XII intact. Muscle strength 5/5 upper/lower extremity. DTRs equal bilaterally. Psych: Normal affect, dress and demeanor. Normal speech. Normal thought content and judgment.   No exam data present  Assessment/plan: Sheena Brennan is a 54 y.o. female present for CPE Osteopenia, unspecified location - Vitamin D (25 hydroxy) Elevated LDL cholesterol level - CBC - Comprehensive metabolic panel - Lipid panel - TSH Diabetes  mellitus screening - Hemoglobin A1c Encounter for preventive health examination Patient was encouraged to exercise greater than 150 minutes a week. Patient was encouraged to choose a diet filled with fresh fruits and vegetables, and lean meats. AVS provided to patient today for education/recommendation on gender specific health and safety maintenance. Colonoscopy: every 5 years for polyps, Dr. Earlean Shawl, UTD--> [id number]. Mammogram:UTD with [id number] Cervical cancer screening: UTD with [facility] Immunizations: tdapUTD 2017, yearly flu shot encouraged.Shingrix declined. Covid declined for now.> Counseled. Had COVID a few weeks ago with mild sx. Infectious disease screening: HIV and Hep Ccompleted DEXA:osteopenia (-2.0) 2015, taking vit D, not calcium. fosamax>>scheduled GYN  Return in about 1 year (around 04/05/2021) for CPE (30 min).   Orders Placed This Encounter  Procedures  . CBC  . Comprehensive metabolic panel  . Hemoglobin A1c  . Lipid panel  . TSH  . Vitamin D (25 hydroxy)   No orders of the defined types were placed in this encounter.  Referral Orders  No referral(s) requested today     Electronically signed by: Howard Pouch, DO [facility] [place]

## 2020-04-05 NOTE — Patient Instructions (Signed)
Health Maintenance, Female Adopting a healthy lifestyle and getting preventive care are important in promoting health and wellness. Ask your health care provider about:  The right schedule for you to have regular tests and exams.  Things you can do on your own to prevent diseases and keep yourself healthy. What should I know about diet, weight, and exercise? Eat a healthy diet   Eat a diet that includes plenty of vegetables, fruits, low-fat dairy products, and lean protein.  Do not eat a lot of foods that are high in solid fats, added sugars, or sodium. Maintain a healthy weight Body mass index (BMI) is used to identify weight problems. It estimates body fat based on height and weight. Your health care provider can help determine your BMI and help you achieve or maintain a healthy weight. Get regular exercise Get regular exercise. This is one of the most important things you can do for your health. Most adults should:  Exercise for at least 150 minutes each week. The exercise should increase your heart rate and make you sweat (moderate-intensity exercise).  Do strengthening exercises at least twice a week. This is in addition to the moderate-intensity exercise.  Spend less time sitting. Even light physical activity can be beneficial. Watch cholesterol and blood lipids Have your blood tested for lipids and cholesterol at 56 years of age, then have this test every 5 years. Have your cholesterol levels checked more often if:  Your lipid or cholesterol levels are high.  You are older than 56 years of age.  You are at high risk for heart disease. What should I know about cancer screening? Depending on your health history and family history, you may need to have cancer screening at various ages. This may include screening for:  Breast cancer.  Cervical cancer.  Colorectal cancer.  Skin cancer.  Lung cancer. What should I know about heart disease, diabetes, and high blood  pressure? Blood pressure and heart disease  High blood pressure causes heart disease and increases the risk of stroke. This is more likely to develop in people who have high blood pressure readings, are of African descent, or are overweight.  Have your blood pressure checked: ? Every 3-5 years if you are 18-39 years of age. ? Every year if you are 40 years old or older. Diabetes Have regular diabetes screenings. This checks your fasting blood sugar level. Have the screening done:  Once every three years after age 40 if you are at a normal weight and have a low risk for diabetes.  More often and at a younger age if you are overweight or have a high risk for diabetes. What should I know about preventing infection? Hepatitis B If you have a higher risk for hepatitis B, you should be screened for this virus. Talk with your health care provider to find out if you are at risk for hepatitis B infection. Hepatitis C Testing is recommended for:  Everyone born from 1945 through 1965.  Anyone with known risk factors for hepatitis C. Sexually transmitted infections (STIs)  Get screened for STIs, including gonorrhea and chlamydia, if: ? You are sexually active and are younger than 56 years of age. ? You are older than 56 years of age and your health care provider tells you that you are at risk for this type of infection. ? Your sexual activity has changed since you were last screened, and you are at increased risk for chlamydia or gonorrhea. Ask your health care provider if   you are at risk.  Ask your health care provider about whether you are at high risk for HIV. Your health care provider may recommend a prescription medicine to help prevent HIV infection. If you choose to take medicine to prevent HIV, you should first get tested for HIV. You should then be tested every 3 months for as long as you are taking the medicine. Pregnancy  If you are about to stop having your period (premenopausal) and  you may become pregnant, seek counseling before you get pregnant.  Take 400 to 800 micrograms (mcg) of folic acid every day if you become pregnant.  Ask for birth control (contraception) if you want to prevent pregnancy. Osteoporosis and menopause Osteoporosis is a disease in which the bones lose minerals and strength with aging. This can result in bone fractures. If you are 65 years old or older, or if you are at risk for osteoporosis and fractures, ask your health care provider if you should:  Be screened for bone loss.  Take a calcium or vitamin D supplement to lower your risk of fractures.  Be given hormone replacement therapy (HRT) to treat symptoms of menopause. Follow these instructions at home: Lifestyle  Do not use any products that contain nicotine or tobacco, such as cigarettes, e-cigarettes, and chewing tobacco. If you need help quitting, ask your health care provider.  Do not use street drugs.  Do not share needles.  Ask your health care provider for help if you need support or information about quitting drugs. Alcohol use  Do not drink alcohol if: ? Your health care provider tells you not to drink. ? You are pregnant, may be pregnant, or are planning to become pregnant.  If you drink alcohol: ? Limit how much you use to 0-1 drink a day. ? Limit intake if you are breastfeeding.  Be aware of how much alcohol is in your drink. In the [place] one drink equals one 12 oz bottle of beer (355 mL), one 5 oz glass of wine (148 mL), or one 1 oz glass of hard liquor (44 mL). General instructions  Schedule regular health, dental, and eye exams.  Stay current with your vaccines.  Tell your health care provider if: ? You often feel depressed. ? You have ever been abused or do not feel safe at home. Summary  Adopting a healthy lifestyle and getting preventive care are important in promoting health and wellness.  Follow your health care provider's instructions about healthy  diet, exercising, and getting tested or screened for diseases.  Follow your health care provider's instructions on monitoring your cholesterol and blood pressure. This information is not intended to replace advice given to you by your health care provider. Make sure you discuss any questions you have with your health care provider. Document Revised: 09/24/2018 Document Reviewed: 09/24/2018 Elsevier Patient Education  2020 Elsevier Inc.  

## 2020-04-06 ENCOUNTER — Telehealth: Payer: Self-pay

## 2020-04-06 NOTE — Telephone Encounter (Signed)
Completed and returned to [place] work station.

## 2020-04-06 NOTE — Telephone Encounter (Signed)
Received paperwork from [facility] with patients CPE information for insurance. Filled out and placed on Dr Dierdre Highman desk to review.

## 2020-04-07 NOTE — Telephone Encounter (Signed)
Pt was called and would like paperwork faxed. Faxed paperwork. Sent to scan.

## 2020-04-13 ENCOUNTER — Encounter: Payer: Self-pay | Admitting: Family Medicine

## 2020-07-06 ENCOUNTER — Telehealth: Payer: Self-pay

## 2020-07-06 NOTE — Telephone Encounter (Signed)
Patient scheduled an appointment on 07/08/20 for urinary frequency & low back pain. Please call her if can be seen any earlier.

## 2020-07-06 NOTE — Telephone Encounter (Signed)
Pt called and confirmed that she can come at 11:45 07/07/20

## 2020-07-07 ENCOUNTER — Encounter: Payer: Self-pay | Admitting: Family Medicine

## 2020-07-07 ENCOUNTER — Ambulatory Visit (INDEPENDENT_AMBULATORY_CARE_PROVIDER_SITE_OTHER): Payer: 59 | Admitting: Family Medicine

## 2020-07-07 ENCOUNTER — Other Ambulatory Visit: Payer: Self-pay

## 2020-07-07 VITALS — BP 123/74 | HR 69 | Temp 99.0°F | Ht 61.5 in | Wt 112.2 lb

## 2020-07-07 DIAGNOSIS — R300 Dysuria: Secondary | ICD-10-CM

## 2020-07-07 DIAGNOSIS — R8290 Unspecified abnormal findings in urine: Secondary | ICD-10-CM

## 2020-07-07 LAB — POCT URINALYSIS DIPSTICK
Bilirubin, UA: NEGATIVE
Glucose, UA: NEGATIVE
Ketones, UA: NEGATIVE
Leukocytes, UA: NEGATIVE
Nitrite, UA: NEGATIVE
Protein, UA: NEGATIVE
Spec Grav, UA: 1.02 (ref 1.010–1.025)
Urobilinogen, UA: 0.2 E.U./dL
pH, UA: 6 (ref 5.0–8.0)

## 2020-07-07 MED ORDER — CEPHALEXIN 500 MG PO CAPS: 500.0000 mg | ORAL_CAPSULE | Freq: Three times a day (TID) | ORAL | 0 refills | Status: DC

## 2020-07-07 NOTE — Progress Notes (Signed)
This visit occurred during the SARS-CoV-2 public health emergency.  Safety protocols were in place, including screening questions prior to the visit, additional usage of staff PPE, and extensive cleaning of exam room while observing appropriate contact time as indicated for disinfecting solutions.    PAETYN PIETRZAK , 09/21/1966, 54 y.o., female MRN: [id number] Patient Care Team    Relationship Specialty Notifications Start End  Ma Hillock, DO PCP - General Family Medicine  03/15/16   Richmond Campbell, MD Consulting Physician Gastroenterology  03/19/16   Arvella Nigh, MD Consulting Physician Obstetrics and Gynecology  04/12/17     Chief Complaint  Patient presents with   Dysuria    pt c/o urine freq, lower abdominal pressure and sharp back pain x 5 days     Subjective: Pt presents for an OV with complaints of urinary frequency  of 5 days duration.  Associated symptoms include abdominal pressure and sharp low back pain. She denies nausea, vomiting or diarrhea. She has had a UTI in the past - pan sensitive e.coli. Treat with Keflex.  She does not have a h/o kidney stones, but she has chronic hematuria which has been worked up by urology and benign per pt.  Pt has tried nothing to ease their symptoms.    Depression screen [facility] 2/9 04/05/2020 07/06/2019 04/21/2018 11/01/2017 10/22/2017  Decreased Interest 0 0 0 0 0  Down, Depressed, Hopeless 0 0 - 0 0  PHQ - 2 Score 0 0 0 0 0    Allergies  Allergen Reactions   Demeclocycline Nausea And Vomiting   Mushroom Extract Complex     Mushrooms- hives   Tetracyclines & Related Nausea And Vomiting   Social History   Social History Narrative   Married to [place]. 2 children (eric and brian)   Bachelors degree, Optometrist, employed.   Denies tobacco, drug or alcohol use.   Drinks caffeinated beverages.   Takes a daily vitamin.   Exercise routinely.   Wears her seatbelt, smoke detector in the home   Firearms in a locked cabinet In the home     Feels safe in her relationships.   Past Medical History:  Diagnosis Date   Allergy    Arthritis    Colon polyp 2017   adenoma, Dr. Earlean Shawl   COVID-19 02/2020   mild flu like symptoms.    Hemorrhoids    banded multiple times   Insomnia    Osteopenia 2015   By DEXA   Past Surgical History:  Procedure Laterality Date   BUNIONECTOMY Bilateral 2017   CESAREAN SECTION  1997, 2000   HEMORRHOID BANDING  03/2016   6th banding   SEPTOPLASTY  1981   Family History  Problem Relation Age of Onset   Osteoporosis Mother    Arthritis Mother    Breast cancer Maternal Grandmother    Heart disease Father    Allergies as of 07/07/2020      Reactions   Demeclocycline Nausea And Vomiting   Mushroom Extract Complex    Mushrooms- hives   Tetracyclines & Related Nausea And Vomiting      Medication List       Accurate as of July 07, 2020  1:43 PM. If you have any questions, ask your nurse or doctor.        alendronate 70 MG tablet Commonly known as: FOSAMAX Take 70 mg by mouth once a week.   cephALEXin 500 MG capsule Commonly known as: KEFLEX Take 1 capsule (500  mg total) by mouth 3 (three) times daily. Started by: Howard Pouch, DO   cholecalciferol 25 MCG (1000 UNIT) tablet Commonly known as: VITAMIN D Take 2,000 Units by mouth daily.   zolpidem 10 MG tablet Commonly known as: AMBIEN Take 10 mg by mouth at bedtime as needed.       All past medical history, surgical history, allergies, family history, immunizations andmedications were updated in the EMR today and reviewed under the history and medication portions of their EMR.     ROS: Negative, with the exception of above mentioned in HPI   Objective:  BP 123/74    Pulse 69    Temp 99 F (37.2 C) (Oral)    Ht 5' 1.5" (1.562 m)    Wt 112 lb 3.2 oz (50.9 kg)    SpO2 100%    BMI 20.86 kg/m  Body mass index is 20.86 kg/m. Gen: Afebrile. No acute distress. Nontoxic in appearance, well developed,  well nourished.  HENT: AT. [place].  Eyes:Pupils Equal Round Reactive to light, Extraocular movements intact,  Conjunctiva without redness, discharge or icterus. Abd: Soft.  Mild suprapubic tenderness present.  ND. BS present. No rebound or guarding.  MSK: No CVA tenderness  Neuro:  Normal gait. PERLA. EOMi. Alert. Oriented x3  No exam data present No results found. Results for orders placed or performed in visit on 07/07/20 (from the past 24 hour(s))  POCT Urinalysis Dipstick     Status: Normal   Collection Time: 07/07/20 12:06 PM  Result Value Ref Range   Color, UA yellow    Clarity, UA clear    Glucose, UA Negative Negative   Bilirubin, UA neg    Ketones, UA neg    Spec Grav, UA 1.020 1.010 - 1.025   Blood, UA 1+    pH, UA 6.0 5.0 - 8.0   Protein, UA Negative Negative   Urobilinogen, UA 0.2 0.2 or 1.0 E.U./dL   Nitrite, UA neg    Leukocytes, UA Negative Negative   Appearance     Odor      Assessment/Plan: CARLTON SWEANEY is a 54 y.o. female present for OV for  Dysuria/abnormal urine Urine with hematuria today.  She has had intermittent chronic hematuria in the past.  She does not have history of kidney stones.  She is going out of town in 2 days, therefore will treat prophylactically with Keflex 3 times daily while we await urine cultures. - POCT Urinalysis Dipstick - Urinalysis w microscopic + reflex cultur Follow-up in 2 weeks if symptoms are not resolved   Reviewed expectations re: course of current medical issues.  Discussed self-management of symptoms.  Outlined signs and symptoms indicating need for more acute intervention.  Patient verbalized understanding and all questions were answered.  Patient received an After-Visit Summary.    Orders Placed This Encounter  Procedures   Urinalysis w microscopic + reflex cultur   POCT Urinalysis Dipstick   Meds ordered this encounter  Medications   cephALEXin (KEFLEX) 500 MG capsule    Sig: Take 1 capsule (500 mg  total) by mouth 3 (three) times daily.    Dispense:  21 capsule    Refill:  0   Referral Orders  No referral(s) requested today     Note is dictated utilizing voice recognition software. Although note has been proof read prior to signing, occasional typographical errors still can be missed. If any questions arise, please do not hesitate to call for verification.   electronically signed  by:  Howard Pouch, DO  [place]

## 2020-07-07 NOTE — Patient Instructions (Signed)
I have called in keflex for you to start today, every 8 hours for 7 days.   We will post results in your mychart for you and if any changes are needed we will call you.

## 2020-07-08 ENCOUNTER — Ambulatory Visit: Payer: 59 | Admitting: Family Medicine

## 2020-07-08 LAB — URINALYSIS W MICROSCOPIC + REFLEX CULTURE
Bacteria, UA: NONE SEEN /HPF
Bilirubin Urine: NEGATIVE
Glucose, UA: NEGATIVE
Hyaline Cast: NONE SEEN /LPF
Ketones, ur: NEGATIVE
Leukocyte Esterase: NEGATIVE
Nitrites, Initial: NEGATIVE
Protein, ur: NEGATIVE
Specific Gravity, Urine: 1.01 (ref 1.001–1.03)
Squamous Epithelial / HPF: NONE SEEN /HPF (ref <=5)
WBC, UA: NONE SEEN /HPF (ref 0–5)
pH: 6 (ref 5.0–8.0)

## 2020-07-08 LAB — NO CULTURE INDICATED

## 2020-08-11 ENCOUNTER — Emergency Department (HOSPITAL_COMMUNITY): Payer: 59

## 2020-08-11 ENCOUNTER — Emergency Department (HOSPITAL_COMMUNITY)
Admission: EM | Admit: 2020-08-11 | Discharge: 2020-08-11 | Disposition: A | Discharge status: Home / Self Care | Payer: 59 | Attending: Emergency Medicine | Admitting: Emergency Medicine

## 2020-08-11 DIAGNOSIS — R410 Disorientation, unspecified: Secondary | ICD-10-CM | POA: Insufficient documentation

## 2020-08-11 DIAGNOSIS — Z8616 Personal history of COVID-19: Secondary | ICD-10-CM | POA: Diagnosis not present

## 2020-08-11 DIAGNOSIS — R42 Dizziness and giddiness: Secondary | ICD-10-CM

## 2020-08-11 LAB — COMPREHENSIVE METABOLIC PANEL
ALT: 16 U/L (ref 0–44)
AST: 23 U/L (ref 15–41)
Albumin: 3.8 g/dL (ref 3.5–5.0)
Alkaline Phosphatase: 42 U/L (ref 38–126)
Anion gap: 10 (ref 5–15)
BUN: 17 mg/dL (ref 6–20)
CO2: 23 mmol/L (ref 22–32)
Calcium: 9.3 mg/dL (ref 8.9–10.3)
Chloride: 106 mmol/L (ref 98–111)
Creatinine, Ser: 0.86 mg/dL (ref 0.44–1.00)
GFR, Estimated: >60 mL/min (ref >60)
Glucose, Bld: 112 mg/dL — ABNORMAL HIGH (ref 70–99)
Potassium: 3.8 mmol/L (ref 3.5–5.1)
Sodium: 139 mmol/L (ref 135–145)
Total Bilirubin: 0.9 mg/dL (ref 0.3–1.2)
Total Protein: 6.7 g/dL (ref 6.5–8.1)

## 2020-08-11 LAB — URINALYSIS, ROUTINE W REFLEX MICROSCOPIC
Bacteria, UA: NONE SEEN
Bilirubin Urine: NEGATIVE
Glucose, UA: NEGATIVE mg/dL
Ketones, ur: NEGATIVE mg/dL
Leukocytes,Ua: NEGATIVE
Nitrite: NEGATIVE
Protein, ur: NEGATIVE mg/dL
Specific Gravity, Urine: 1.006 (ref 1.005–1.030)
pH: 8 (ref 5.0–8.0)

## 2020-08-11 LAB — ETHANOL: Alcohol, Ethyl (B): <10 mg/dL (ref <10)

## 2020-08-11 LAB — CBC WITH DIFFERENTIAL/PLATELET
Abs Immature Granulocytes: 0.03 10*3/uL (ref 0.00–0.07)
Basophils Absolute: 0 10*3/uL (ref 0.0–0.1)
Basophils Relative: 0 %
Eosinophils Absolute: 0 10*3/uL (ref 0.0–0.5)
Eosinophils Relative: 0 %
HCT: 42.6 % (ref 36.0–46.0)
Hemoglobin: 14.1 g/dL (ref 12.0–15.0)
Immature Granulocytes: 0 %
Lymphocytes Relative: 17 %
Lymphs Abs: 1.6 10*3/uL (ref 0.7–4.0)
MCH: 30.5 pg (ref 26.0–34.0)
MCHC: 33.1 g/dL (ref 30.0–36.0)
MCV: 92 fL (ref 80.0–100.0)
Monocytes Absolute: 0.4 10*3/uL (ref 0.1–1.0)
Monocytes Relative: 4 %
Neutro Abs: 7.4 10*3/uL (ref 1.7–7.7)
Neutrophils Relative %: 79 %
Platelets: 191 10*3/uL (ref 150–400)
RBC: 4.63 MIL/uL (ref 3.87–5.11)
RDW: 12.5 % (ref 11.5–15.5)
WBC: 9.4 10*3/uL (ref 4.0–10.5)
nRBC: 0 % (ref 0.0–0.2)

## 2020-08-11 LAB — RAPID URINE DRUG SCREEN, HOSP PERFORMED
Amphetamines: NOT DETECTED
Barbiturates: NOT DETECTED
Benzodiazepines: NOT DETECTED
Cocaine: NOT DETECTED
Opiates: NOT DETECTED
Tetrahydrocannabinol: NOT DETECTED

## 2020-08-11 LAB — PROTIME-INR
INR: 1 (ref 0.8–1.2)
Prothrombin Time: 12.5 seconds (ref 11.4–15.2)

## 2020-08-11 NOTE — ED Provider Notes (Signed)
[facility] Provider Note   CSN: [id number] Arrival date & time: 08/11/20  1442     History Chief Complaint  Patient presents with  . Dizziness    Sheena Brennan is a 54 y.o. female with past medical history significant for arthritis, COVID-19, hemorrhoids, insomnia, osteopenia.  HPI Patient presents to emergency department today with chief complaint of sudden onset of dizziness and confusion. Triage note states this is been going on for a few days.  Clarified with the patient she said everything was sudden onset today.  Yesterday she was normal.  Patient states she needs her son every Thursday for lunch.  When it was time to go she stood up from her desk at work and was walking down the hallway.  She states she felt off.  She describes it as an out of body experience.  While she was driving she had double vision.  She states she was seeing the cars next to each other.  She also saw the yellow lines all over the road instead of just in the middle.  He wears glasses for distance and was wearing them while she was driving.  She was confused and forgot where she was going.  When she made to the restaurant her son noticed she was not acting like her usual self.  He stated her speech was very slow and she continued to have confusion.  They walked to the car to leave and patient felt off balance like she was leaning to the right. Patient admits to being under increased amount of stress at work.  She works at an [employer] firm and states this time of year is always busy for her.  She has been having daily headaches.  The headaches are located behind her eyes.  She generally does not have headaches.  She states the headaches resolved with Tylenol.  The amount of stress is usual she says.  She denies any drug use, she rarely has alcohol consumption stating maybe once a week.  She denies any recent illness, medication changes, or tick bite.  She traveled to  [place] 2 weeks ago and felt like her normal self. No medications for her symptoms prior to arrival. She denies recent head injury or fall, fever, chills, shortness of breath, chest pain, abdominal pain, nausea, vomiting, urinary symptoms, diarrhea, numbness, weakness, tingling.   Her blurry vision lasted less than 1 hour. Her feeling off balance lasted longer than 1 hour.      Past Medical History:  Diagnosis Date  . Allergy   . Arthritis   . Colon polyp 2017   adenoma, Dr. Earlean Shawl  . COVID-19 02/2020   mild flu like symptoms.   . Hemorrhoids    banded multiple times  . Insomnia   . Osteopenia 2015   By DEXA    Patient Active Problem List   Diagnosis Date Noted  . Urticaria 11/01/2017  . Hematuria 03/13/2017  . Insomnia 04/09/2016  . Postmenopausal estrogen deficiency 01/24/2016  . Encounter for long-term (current) use of medications 01/24/2016  . Osteopenia 01/24/2016    Past Surgical History:  Procedure Laterality Date  . BUNIONECTOMY Bilateral 2017  . CESAREAN SECTION  1997, 2000  . HEMORRHOID BANDING  03/2016   6th banding  . SEPTOPLASTY  1981     OB History    Gravida  4   Para  2   Term      Preterm      AB  Living  2     SAB      TAB      Ectopic      Multiple      Live Births              Family History  Problem Relation Age of Onset  . Osteoporosis Mother   . Arthritis Mother   . Breast cancer Maternal Grandmother   . Heart disease Father     Social History   Tobacco Use  . Smoking status: Never Smoker  . Smokeless tobacco: Never Used  Vaping Use  . Vaping Use: Never used  Substance Use Topics  . Alcohol use: No    Alcohol/week: 0.0 standard drinks  . Drug use: No    Home Medications Prior to Admission medications   Medication Sig Start Date End Date Taking? Authorizing Provider  acetaminophen (TYLENOL) 500 MG tablet Take 500-1,000 mg by mouth every 6 (six) hours as needed for mild pain (or headaches).    Yes  [provider]  alendronate (FOSAMAX) 70 MG tablet Take 70 mg by mouth every Monday.  10/04/17  Yes [provider]  Cholecalciferol (VITAMIN D3) 125 MCG (5000 UT) CAPS Take 10,000 Units by mouth every 7 (seven) days.   Yes [provider]  zolpidem (AMBIEN) 10 MG tablet Take 5 mg by mouth at bedtime as needed for sleep.  02/27/16  Yes [provider]  cephALEXin (KEFLEX) 500 MG capsule Take 1 capsule (500 mg total) by mouth 3 (three) times daily. Patient not taking: Reported on 08/11/2020 07/07/20   Howard Pouch A, DO    Allergies    Demeclocycline, Mushroom extract complex, and Tetracyclines & related  Review of Systems   Review of Systems All other systems are reviewed and are negative for acute change except as noted in the HPI.  Physical Exam Updated Vital Signs BP 134/89 (BP Location: Left Arm)   Pulse 70   Temp 98.4 F (36.9 C) (Oral)   Resp 16   SpO2 100%   Physical Exam Vitals and nursing note reviewed.  Constitutional:      General: She is not in acute distress.    Appearance: She is not ill-appearing.  HENT:     Head: Normocephalic and atraumatic.     Right Ear: Tympanic membrane and external ear normal.     Left Ear: Tympanic membrane and external ear normal.     Nose: Nose normal.     Mouth/Throat:     Mouth: Mucous membranes are moist.     Pharynx: Oropharynx is clear.  Eyes:     General: No scleral icterus.       Right eye: No discharge.        Left eye: No discharge.     Extraocular Movements: Extraocular movements intact.     Conjunctiva/sclera: Conjunctivae normal.     Pupils: Pupils are equal, round, and reactive to light.  Neck:     Vascular: No JVD.  Cardiovascular:     Rate and Rhythm: Normal rate and regular rhythm.     Pulses: Normal pulses.          Radial pulses are 2+ on the right side and 2+ on the left side.     Heart sounds: Normal heart sounds.  Pulmonary:     Comments: Lungs clear to auscultation  in all fields. Symmetric chest rise. No wheezing, rales, or rhonchi. Abdominal:     Comments: Abdomen is soft, non-distended, and  non-tender in all quadrants. No rigidity, no guarding. No peritoneal signs.  Musculoskeletal:        General: Normal range of motion.     Cervical back: Normal range of motion.  Skin:    General: Skin is warm and dry.     Capillary Refill: Capillary refill takes less than 2 seconds.     Findings: No rash.  Neurological:     Mental Status: She is oriented to person, place, and time.     GCS: GCS eye subscore is 4. GCS verbal subscore is 5. GCS motor subscore is 6.     Comments: Mental Status:  Alert, oriented, thought content appropriate, able to give a coherent history. Speech fluent without evidence of aphasia. Able to follow 2 step commands without difficulty.  Cranial Nerves:  II:  Peripheral visual fields grossly normal, pupils equal, round, reactive to light III,IV, VI: ptosis not present, extra-ocular motions intact bilaterally  V,VII: smile symmetric, facial light touch sensation equal VIII: hearing grossly normal to voice  X: uvula elevates symmetrically  XI: bilateral shoulder shrug symmetric and strong XII: midline tongue extension without fassiculations Motor:  Normal tone. 5/5 in upper and lower extremities bilaterally including strong and equal grip strength and dorsiflexion/plantar flexion Sensory: Pinprick and light touch normal in all extremities.  Deep Tendon Reflexes: 2+ and symmetric in the biceps and patella Cerebellar: normal finger-to-nose with bilateral upper extremities Gait: normal gait and balance CV: distal pulses palpable throughout     Psychiatric:        Behavior: Behavior normal.     ED Results / Procedures / Treatments   Labs (all labs ordered are listed, but only abnormal results are displayed) Labs Reviewed  COMPREHENSIVE METABOLIC PANEL - Abnormal; Notable for the following components:      Result Value    Glucose, Bld 112 (*)    All other components within normal limits  URINALYSIS, ROUTINE W REFLEX MICROSCOPIC - Abnormal; Notable for the following components:   Color, Urine COLORLESS (*)    Hgb urine dipstick SMALL (*)    All other components within normal limits  CBC WITH DIFFERENTIAL/PLATELET  ETHANOL  PROTIME-INR  RAPID URINE DRUG SCREEN, HOSP PERFORMED    EKG EKG Interpretation  Date/Time:  Thursday August 11 2020 14:46:55 EDT Ventricular Rate:  70 PR Interval:    QRS Duration: 74 QT Interval:  416 QTC Calculation: 449 R Axis:   67 Text Interpretation: Sinus rhythm No old tracing to compare Confirmed by Lacretia Leigh ([id number]) on 08/11/2020 4:25:58 PM   Radiology MR BRAIN WO CONTRAST  Result Date: 08/11/2020 CLINICAL DATA:  Dizziness, nonspecific. EXAM: MRI HEAD WITHOUT CONTRAST TECHNIQUE: Multiplanar, multiecho pulse sequences of the brain and surrounding structures were obtained without intravenous contrast. COMPARISON:  No pertinent prior exams are available for comparison. FINDINGS: Brain: Cerebral volume is normal. No focal parenchymal signal abnormality is identified. There is no acute infarct. No evidence of intracranial mass. No chronic intracranial blood products. No extra-axial fluid collection. No midline shift. Vascular: Expected proximal arterial flow voids. Skull and upper cervical spine: No focal marrow lesion. Sinuses/Orbits: Visualized orbits show no acute finding. 10 mm mucous retention cyst or polyps within the superomedial left maxillary sinus/ethmoid infundibulum. No significant mastoid effusion. IMPRESSION: Unremarkable non-contrast MRI appearance of the brain. No evidence of acute intracranial abnormality. 10 mm mucous retention cysts or polyp within the superomedial left maxillary sinus/ethmoid infundibulum. Electronically Signed   By: Kellie Simmering DO   On: 08/11/2020 19:52  Procedures Procedures (including critical care time)  Medications Ordered in  ED Medications - No data to display  ED Course  I have reviewed the triage vital signs and the nursing notes.  Pertinent labs & imaging results that were available during my care of the patient were reviewed by me and considered in my medical decision making (see chart for details).    MDM Rules/Calculators/A&P                          History provided by patient with additional history obtained from chart review.  54 yo female presenting with brief episode of confusion and dizziness. VSS. She is well appearing and in no acute distress. Neuro exam is normal. She is asymptomatic by the time of my exam. CBC and CMP are overall unremarkable.  INR within normal range.  Alcohol is negative.  UA is negative for infection.  UDS is negative.   Discussed patient with [facility] attending Dr. Zenia Resides who agrees with plan for MRI.  MRI performed and is negative for any acute abnormalities.  Patient continues to remain to be at baseline on reassessment.  Given her reassuring unremarkable work-up will discharge home with recommendations to follow-up with PCP. The patient appears reasonably screened and/or stabilized for discharge and I doubt any other medical condition or other [facility] requiring further screening, evaluation, or treatment in the ED at this time prior to discharge. The patient is safe for discharge with strict return precautions discussed.    Portions of this note were generated with Lobby[employer]. Dictation errors may occur despite best attempts at proofreading.  Final Clinical Impression(s) / ED Diagnoses Final diagnoses:  Dizziness    Rx / DC Orders ED Discharge Orders    None       Flint Melter 08/11/20 2102    Lacretia Leigh, MD 08/15/20 2316

## 2020-08-11 NOTE — ED Triage Notes (Signed)
Pt coming from urgent care, was transferred for additional care after periods of dizziness and confusion over past few days. Was driving and had episode of blurred vision and confusion. No hx of seizures/stroke, no neurological deficits at this time

## 2020-08-11 NOTE — ED Notes (Signed)
Urine specimen sent, along with urine culture tube.

## 2020-08-11 NOTE — Discharge Instructions (Signed)
The MRI did not show any abnormalities with your brain. Your blood work was normal  It did show mucous retention cysts or polyp within the left sinus. Follow up with an Ears, Nose and Throat doctor if needed.  You should do your best to stay well hydrated and drink plenty of water through out the day.  Follow up with your primary care doctor for symptom recheck.   We have also given you information for a local neurology group you can follow up with if needed  Return to the emergency department if you have worsening symptoms.

## 2020-09-06 ENCOUNTER — Encounter: Payer: Self-pay | Admitting: Neurology

## 2020-09-06 ENCOUNTER — Ambulatory Visit (INDEPENDENT_AMBULATORY_CARE_PROVIDER_SITE_OTHER): Payer: 59 | Admitting: Neurology

## 2020-09-06 VITALS — BP 146/91 | HR 68 | Ht 61.0 in | Wt 114.0 lb

## 2020-09-06 DIAGNOSIS — R42 Dizziness and giddiness: Secondary | ICD-10-CM

## 2020-09-06 DIAGNOSIS — R6889 Other general symptoms and signs: Secondary | ICD-10-CM

## 2020-09-06 DIAGNOSIS — G4719 Other hypersomnia: Secondary | ICD-10-CM

## 2020-09-06 DIAGNOSIS — R351 Nocturia: Secondary | ICD-10-CM

## 2020-09-06 DIAGNOSIS — G454 Transient global amnesia: Secondary | ICD-10-CM | POA: Diagnosis not present

## 2020-09-06 DIAGNOSIS — G44209 Tension-type headache, unspecified, not intractable: Secondary | ICD-10-CM

## 2020-09-06 DIAGNOSIS — R410 Disorientation, unspecified: Secondary | ICD-10-CM

## 2020-09-06 DIAGNOSIS — R0683 Snoring: Secondary | ICD-10-CM

## 2020-09-06 DIAGNOSIS — Z8669 Personal history of other diseases of the nervous system and sense organs: Secondary | ICD-10-CM

## 2020-09-06 NOTE — Progress Notes (Signed)
Epworth Sleepiness Scale 0= would never doze 1= slight chance of dozing 2= moderate chance of dozing 3= high chance of dozing  Sitting and reading: 1 Watching TV: 1 Sitting inactive in a public place (ex. Theater or meeting): 2 As a passenger in a car for an hour without a break: 0 Lying down to rest in the afternoon: 1 Sitting and talking to someone: 0 Sitting quietly after lunch (no alcohol): 0 In a car, while stopped in traffic: 0 Total: 5

## 2020-09-06 NOTE — Progress Notes (Signed)
eSubjective:    Patient ID: Sheena Brennan is a 54 y.o. female.  HPI     History:   I saw patient, Sheena Brennan, as a referral from the ED for dizziness. The patient is unaccompanied today.  A 54 year old right-handed woman with an underlying medical history of osteopenia, insomnia, arthritis, allergies, history of suspected COVID-19, who presented to the emergency room on 08/11/2020 with a history of new onset dizziness and confusion which started the day prior. She reported having double vision while driving.  She felt confused while driving.  She noted that her speech was slow.  She endorsed increased amount of stress at work.  She had noticed a recent headache located behind her eyes.  I reviewed the emergency room records. CBC and CMP were unremarkable, UDS negative, alcohol level negative, urinalysis benign. Her visual symptoms lasted for less than an hour, her dizziness lasted longer.  Her blood sugar was unremarkable.  EKG showed sinus rhythm.  Neurological exam was nonfocal.  She had a brain MRI without contrast on 08/11/2020 and I reviewed the results: IMPRESSION: Unremarkable non-contrast MRI appearance of the brain. No evidence of acute intracranial abnormality.   10 mm mucous retention cysts or polyp within the superomedial left maxillary sinus/ethmoid infundibulum.   He reports feeling at baseline, dizziness did not last long, started when she got up from the dentist to go to the restaurant to meet her son which she does on a weekly basis.  They typically meet at the same restaurant.  She got confused while driving, had some blurry vision and also double vision.  She has routine eye examinations every year but has not scheduled a follow-up for this episode.  She does endorse stress, but feels that she did not have any particular increase in stress.  She has had some recurrent headaches which are dull and frontal, not typically throbbing, typically not associated with nausea or  vomiting or light sensitivity.  She does not sleep very well and this is a chronic problem.  She takes Ambien very sparingly and did not take any Ambien the night before.  She does snore, she has daytime tiredness, Epworth sleepiness score is 5 out of 24 today.  She had a sleep evaluation several years ago and was negative for sleep apnea, she recalls having had a sleep test at home.  She has nocturia about once per average night and rare morning headaches. Sometimes she takes Tylenol for her headaches. She does not drink caffeine on a daily basis, drinks decaf coffee, 1 cup in the morning, tries to hydrate well with water, alcohol rarely, non-smoker.  She does not have any visual symptoms now.  She did not have any one-sided weakness or numbness or tingling or droopy face or slurring of speech.  She remembers calling her husband who noted that she was talking very monotonously.  She reports that she may have had Covid in May 2021.  Her husband was tested for it but she did not get tested.  Her Past Medical History Is Significant For: Past Medical History:  Diagnosis Date  . Allergy   . Arthritis   . Colon polyp 2017   adenoma, Dr. Earlean Shawl  . COVID-19 02/2020   mild flu like symptoms.   . Hemorrhoids    banded multiple times  . Insomnia   . Osteopenia 2015   By DEXA    Her Past Surgical History Is Significant For: Past Surgical History:  Procedure Laterality Date  . BUNIONECTOMY  Bilateral 2017  . CESAREAN SECTION  1997, 2000  . HEMORRHOID BANDING  03/2016   6th banding  . SEPTOPLASTY  1981    Her Family History Is Significant For: Family History  Problem Relation Age of Onset  . Osteoporosis Mother   . Arthritis Mother   . Breast cancer Maternal Grandmother   . Heart disease Father     Her Social History Is Significant For: Social History   Socioeconomic History  . Marital status: Married    Spouse name: Not on file  . Number of children: Not on file  . Years of  education: Not on file  . Highest education level: Not on file  Occupational History  . Not on file  Tobacco Use  . Smoking status: Never Smoker  . Smokeless tobacco: Never Used  Vaping Use  . Vaping Use: Never used  Substance and Sexual Activity  . Alcohol use: No    Alcohol/week: 0.0 standard drinks  . Drug use: No  . Sexual activity: Yes    Partners: Male    Birth control/protection: Post-menopausal    Comment: Married  Other Topics Concern  . Not on file  Social History Narrative   Married to [place]. 2 children (eric and brian)   Bachelors degree, Optometrist, employed.   Denies tobacco, drug or alcohol use.   Drinks caffeinated beverages.   Takes a daily vitamin.   Exercise routinely.   Wears her seatbelt, smoke detector in the home   Firearms in a locked cabinet In the home   Feels safe in her relationships.   Social Determinants of Health   Financial Resource Strain:   . Difficulty of Paying Living Expenses: Not on file  Food Insecurity:   . Worried About Charity fundraiser in the Last Year: Not on file  . Ran Out of Food in the Last Year: Not on file  Transportation Needs:   . Lack of Transportation (Medical): Not on file  . Lack of Transportation (Non-Medical): Not on file  Physical Activity:   . Days of Exercise per Week: Not on file  . Minutes of Exercise per Session: Not on file  Stress:   . Feeling of Stress : Not on file  Social Connections:   . Frequency of Communication with Friends and Family: Not on file  . Frequency of Social Gatherings with Friends and Family: Not on file  . Attends Religious Services: Not on file  . Active Member of Clubs or Organizations: Not on file  . Attends Archivist Meetings: Not on file  . Marital Status: Not on file    Her Allergies Are:  Allergies  Allergen Reactions  . Demeclocycline Nausea And Vomiting and Other (See Comments)    BRAND NAME: "Declomycin"- antibiotic  . Mushroom Extract Complex Hives  and Other (See Comments)    Mushrooms = HIVES  . Tetracyclines & Related Nausea And Vomiting  :   Her Current Medications Are:  Outpatient Encounter Medications as of 09/06/2020  Medication Sig  . acetaminophen (TYLENOL) 500 MG tablet Take 500-1,000 mg by mouth every 6 (six) hours as needed for mild pain (or headaches).   Marland Kitchen alendronate (FOSAMAX) 70 MG tablet Take 70 mg by mouth every Monday.   . Cholecalciferol (VITAMIN D3) 125 MCG (5000 UT) CAPS Take 10,000 Units by mouth every 7 (seven) days.  Marland Kitchen zolpidem (AMBIEN) 10 MG tablet Take 5 mg by mouth at bedtime as needed for sleep.   . [DISCONTINUED] cephALEXin (  KEFLEX) 500 MG capsule Take 1 capsule (500 mg total) by mouth 3 (three) times daily. (Patient not taking: Reported on 08/11/2020)   No facility-administered encounter medications on file as of 09/06/2020.  :  Review of Systems:  Out of a complete 14 point review of systems, all are reviewed and negative with the exception of these symptoms as listed below: Review of Systems  Neurological:       Pt presents today to discuss her episode of confusion. She went to meet her son for lunch, felt strange, and became confused with double vision. She was taken to urgent care.     Objective:  Neurological Exam  Physical Exam Physical Examination:   Vitals:   09/06/20 1015  BP: (!) 146/91  Pulse: 68    General Examination: The patient is a very pleasant 54 y.o. female in no acute distress. She appears well-developed and well-nourished and well groomed.   HEENT: Normocephalic, atraumatic, pupils are equal, round and reactive to light and accommodation. Funduscopic exam is normal with sharp disc margins noted. Extraocular tracking is good without limitation to gaze excursion or nystagmus noted. Normal smooth pursuit is noted. Hearing is grossly intact. Face is symmetric with normal facial animation and normal facial sensation. Speech is clear with no dysarthria noted. There is no  hypophonia. There is no lip, neck/head, jaw or voice tremor. Neck is supple with full range of passive and active motion. There are no carotid bruits on auscultation. Oropharynx exam reveals: mild mouth dryness, good dental hygiene and moderate airway crowding, due to small airway entry, tonsils and uvula not fully visualized, Mallampati class III, wider tongue noted.  Tongue protrudes centrally and palate elevates symmetrically.  Neck circumference of 13-1/4 inches.    Chest: Clear to auscultation without wheezing, rhonchi or crackles noted.  Heart: S1+S2+0, regular and normal without murmurs, rubs or gallops noted.   Abdomen: Soft, non-tender and non-distended with normal bowel sounds appreciated on auscultation.  Extremities: There is no pitting edema in the distal lower extremities bilaterally. Pedal pulses are intact.  Skin: Warm and dry without trophic changes noted.  Musculoskeletal: exam reveals no obvious joint deformities, tenderness or joint swelling or erythema.   Neurologically:  Mental status: The patient is awake, alert and oriented in all 4 spheres. Her immediate and remote memory, attention, language skills and fund of knowledge are appropriate. There is no evidence of aphasia, agnosia, apraxia or anomia. Speech is clear with normal prosody and enunciation. Thought process is linear. Mood is normal and affect is normal.  Cranial nerves II - XII are as described above under HEENT exam. In addition: shoulder shrug is normal with equal shoulder height noted. Motor exam: Normal bulk, strength and tone is noted. There is no drift, tremor or rebound. Romberg is negative. Reflexes are 2+ throughout. Babinski: Toes are flexor bilaterally. Fine motor skills and coordination: intact with normal finger taps, normal hand movements, normal rapid alternating patting, normal foot taps and normal foot agility.  Cerebellar testing: No dysmetria or intention tremor on finger to nose testing. Heel to  shin is unremarkable bilaterally. There is no truncal or gait ataxia.  Sensory exam: intact to light touch, pinprick, vibration, temperature sense in the upper and lower extremities.  Gait, station and balance: She stands easily. No veering to one side is noted. No leaning to one side is noted. Posture is age-appropriate and stance is narrow based. Gait shows normal stride length and normal pace. No problems turning are noted. Tandem  walk is unremarkable.   Assessment and Plan:   In summary, NAYLENE FOELL is a very pleasant 54 y.o.-year old female with an underlying medical history of osteopenia, insomnia, arthritis, allergies, history of suspected COVID-19, who presents for evaluation of an episode of confusion/disorientation, lapses in memory.  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  She had evaluation in the emergency room on 08/11/2020 and findings were benign including a brain MRI without contrast, blood work and urine testing.  Examination was nonfocal at the time and continues to be nonfocal.  She feels at baseline, did not have any sudden onset of one-sided weakness or numbness or tingling or droopy face or slurring of speech or convulsions or automatisms.  She did have transient diplopia for which she is advised to seek evaluation through her eye specialist for a sooner than scheduled appointment as her next appointment is next year.  She is reassured today.  She does have history of stress and poor sleep, possibly sleep deprivation resulting in headaches and also possibility of tension headaches, with no history of migraines in the past and headache description not supportive of migrainous headaches.  I would like to proceed with a home sleep test to evaluate her for sleep disordered breathing.  She may be at risk for sleep apnea given her snoring and small airway and difficulty with sleep consolidation.  She also reports nocturia.  We will also proceed with  an EEG for completion.  She is advised that we will call with the test results and follow-up in this clinic if needed.  If she has obstructive sleep apnea, she is advised to consider treatment with an AutoPap machine.  She is advised to request eye examination records to be sent to our clinic for completion.  She is advised to continue to pursue healthy lifestyle, good hydration with water, we talked about the importance of stress management as well.  I answered all her questions today and she was in agreement with the above plan. Star Age, MD, PhD

## 2020-09-06 NOTE — Patient Instructions (Signed)
It was nice to meet you today.  I'm glad to hear that you're feeling better.  For your episode of confusion or disorientation and with a lapse in memory reported, I would like to proceed with an EEG (brainwave test), which we will schedule. We will call you with the results.  You may have had an episode of transient global amnesia (TGA).  This is a not fully or well understood condition which typically happens only rarely or once in the patient's lifetime, during which patients can have some loss of time, typically no stroke like presentation and patients typically continue to talk and move, but have no or poor recollection of the period, which can last for minutes or hours. We don't quite understand the etiology or significance of TGA as yet. Some reports indicate a migraine-like etiology, some others mention a seizure-like presentation or even stroke-like origin.   You had recent blood work through the emergency room and your neurological exam is normal.  As discussed, we will also proceed with a home sleep test to evaluate you for sleep apnea as you don't sleep very well and have daytime tiredness and history of snoring.  You do have a small airway as well.  If you have obstructive sleep apnea based on your home sleep test, I will likely ask you to try treatment with a CPAP-like machine called AutoPap therapy.  We will call you with your test results and take it from there.  Please also make an appointment with your eye specialist/ophthalmologist for a full eye examination as you had an episode of double vision.  Please asked them to fax results of their appointment to [place] as well for completion.  If your tests are benign, I can see you back as needed.  Please continue to maintain a healthy lifestyle, try to stay well-hydrated, well rested, work on stress reduction and exercise in moderation.

## 2020-09-12 ENCOUNTER — Ambulatory Visit (INDEPENDENT_AMBULATORY_CARE_PROVIDER_SITE_OTHER): Payer: 59 | Admitting: Neurology

## 2020-09-12 ENCOUNTER — Other Ambulatory Visit: Payer: Self-pay

## 2020-09-12 DIAGNOSIS — R410 Disorientation, unspecified: Secondary | ICD-10-CM | POA: Diagnosis not present

## 2020-09-12 DIAGNOSIS — G454 Transient global amnesia: Secondary | ICD-10-CM

## 2020-09-12 DIAGNOSIS — R42 Dizziness and giddiness: Secondary | ICD-10-CM

## 2020-09-12 DIAGNOSIS — Z8669 Personal history of other diseases of the nervous system and sense organs: Secondary | ICD-10-CM

## 2020-09-12 DIAGNOSIS — R6889 Other general symptoms and signs: Secondary | ICD-10-CM

## 2020-09-22 NOTE — Procedures (Signed)
   HISTORY: 54 year old female presented with dizziness, confusion  TECHNIQUE:  This is a routine 16 channel EEG recording with one channel devoted to a limited EKG recording.  It was performed during wakefulness, drowsiness and asleep.  Hyperventilation and photic stimulation were performed as activating procedures.  There are minimum muscle and movement artifact noted.  Upon maximum arousal, posterior dominant waking rhythm consistent of rhythmic alpha range activity, with frequency of 10 hz. Activities are symmetric over the bilateral posterior derivations and attenuated with eye opening.  There was occasionally sharp protrusion of large amplitude 3 to 5 Hz activities, that is related to drowsiness  Hyperventilation produced mild/moderate buildup with higher amplitude and the slower activities noted.  Photic stimulation did not alter the tracing.  During EEG recording, patient developed drowsiness and no deeper stage of sleep was achieved During EEG recording, there was no epileptiform discharge noted.  EKG demonstrate sinus rhythm, with heart rate of 64 bpm  CONCLUSION: This is a  normal awake EEG.  There is no electrodiagnostic evidence of epileptiform discharge.  Marcial Pacas, M.D. Ph.D.  [facility] [place], [place] [address] Phone: [phone] Fax:      [phone]

## 2020-09-23 NOTE — Progress Notes (Signed)
Please call patient and advise her that her EEG which is the electrical brainwave test was normal in the awake state.

## 2020-09-26 ENCOUNTER — Telehealth: Payer: Self-pay

## 2020-09-26 NOTE — Telephone Encounter (Signed)
-----   Message from Star Age, MD sent at 09/23/2020 10:19 AM EST ----- Please call patient and advise her that her EEG which is the electrical brainwave test was normal in the awake state.

## 2020-09-26 NOTE — Telephone Encounter (Signed)
I called pt. No answer. I left a detailed message on her cell phone VM per DPR advising her of normal EEG results and asking her to call me back with questions or concerns.

## 2020-10-04 ENCOUNTER — Ambulatory Visit (INDEPENDENT_AMBULATORY_CARE_PROVIDER_SITE_OTHER): Payer: 59 | Admitting: Neurology

## 2020-10-04 DIAGNOSIS — R410 Disorientation, unspecified: Secondary | ICD-10-CM

## 2020-10-04 DIAGNOSIS — G44209 Tension-type headache, unspecified, not intractable: Secondary | ICD-10-CM

## 2020-10-04 DIAGNOSIS — Z8669 Personal history of other diseases of the nervous system and sense organs: Secondary | ICD-10-CM

## 2020-10-04 DIAGNOSIS — R0683 Snoring: Secondary | ICD-10-CM

## 2020-10-04 DIAGNOSIS — R42 Dizziness and giddiness: Secondary | ICD-10-CM

## 2020-10-04 DIAGNOSIS — G4733 Obstructive sleep apnea (adult) (pediatric): Secondary | ICD-10-CM

## 2020-10-04 DIAGNOSIS — R6889 Other general symptoms and signs: Secondary | ICD-10-CM

## 2020-10-04 DIAGNOSIS — G454 Transient global amnesia: Secondary | ICD-10-CM

## 2020-10-04 DIAGNOSIS — R351 Nocturia: Secondary | ICD-10-CM

## 2020-10-04 DIAGNOSIS — G4719 Other hypersomnia: Secondary | ICD-10-CM

## 2020-10-16 NOTE — Progress Notes (Signed)
Patient was referred from the ED for dizziness and confusion.  Office visit on 09/06/2020.  She reported not sleeping well and reported snoring.  She had a home sleep test on 10/04/2020 which did not indicate any significant sleep apnea.  Please update patient. She had mild intermittent snoring.

## 2020-10-16 NOTE — Procedures (Signed)
   [facility]  HOME SLEEP TEST (Watch PAT)  STUDY DATE: 10/04/20  DOB: 1966/01/30  MRN: [id number]  ORDERING CLINICIAN: Huston Foley, MD, PhD   REFERRING CLINICIAN: Claiborne Billings, Renee A, DO   CLINICAL INFORMATION/HISTORY: 55 year old woman with a history of osteopenia, insomnia, arthritis, allergies, history of suspected COVID-19, who presented to the emergency room on 08/11/2020 with a history of new onset dizziness and confusion. She does not sleep well and reports snoring.   Epworth sleepiness score: 5/24.  BMI: 21.54 kg/m  FINDINGS:   Total Record Time (hours, min): 6 H 25 min  Total Sleep Time (hours, min):  5 H 31 min   Percent REM (%):    13.13%   Calculated pAHI (per hour):  1.6       REM pAHI:    4.3     NREM pAHI: 1.3 Supine AHI: 5.2   Oxygen Saturation (%) Mean:  95  Minimum oxygen saturation (%):        91   O2 Saturation Range (%): 51-95  O2Saturation (minutes) <=88%: 0 min   Pulse Mean (bpm):    61   Pulse Range (51-95)   IMPRESSION: Primary snoring   RECOMMENDATION:  This home sleep test does not demonstrate any significant obstructive or central sleep disordered breathing. Mild intermittent snoring was noted. Other causes of the patient's symptoms, including circadian rhythm disturbances, an underlying mood disorder, medication effect and/or an underlying medical problem cannot be ruled out based on this test. Clinical correlation is recommended. The patient should be cautioned not to drive, work at heights, or operate dangerous or heavy equipment when tired or sleepy. Review and reiteration of good sleep hygiene measures should be pursued with any patient. The patient will be notified of the test results.   I certify that I have reviewed the raw data recording prior to the issuance of this report in accordance with the standards of the American Academy of Sleep Medicine (AASM).  INTERPRETING PHYSICIAN:  Huston Foley, MD, PhD  Board  Certified in Neurology and Sleep Medicine [facility] [address], [address] [place], [place] [address] [phone]

## 2020-10-17 ENCOUNTER — Telehealth: Payer: Self-pay

## 2020-10-17 NOTE — Telephone Encounter (Signed)
I called pt. I discussed her sleep study results. Pt will follow up as needed. Pt verbalized understanding of results. Pt had no questions at this time but was encouraged to call back if questions arise.

## 2020-10-17 NOTE — Telephone Encounter (Signed)
-----   Message from Huston Foley, MD sent at 10/16/2020  8:53 PM EST ----- Patient was referred from the ED for dizziness and confusion.  Office visit on 09/06/2020.  She reported not sleeping well and reported snoring.  She had a home sleep test on 10/04/2020 which did not indicate any significant sleep apnea.  Please update patient. She had mild intermittent snoring.

## 2020-10-27 ENCOUNTER — Ambulatory Visit: Payer: 59 | Admitting: Neurology

## 2021-07-27 DIAGNOSIS — Z1211 Encounter for screening for malignant neoplasm of colon: Secondary | ICD-10-CM | POA: Diagnosis not present

## 2021-07-27 DIAGNOSIS — Z86010 Personal history of colonic polyps: Secondary | ICD-10-CM | POA: Diagnosis not present

## 2021-07-27 LAB — HM COLONOSCOPY

## 2021-08-15 ENCOUNTER — Other Ambulatory Visit: Payer: Self-pay

## 2021-08-15 ENCOUNTER — Encounter: Payer: Self-pay | Admitting: Family Medicine

## 2021-08-15 ENCOUNTER — Ambulatory Visit: Payer: BC Managed Care – PPO | Admitting: Family Medicine

## 2021-08-15 VITALS — BP 135/87 | HR 72 | Temp 98.2°F | Ht 61.0 in | Wt 117.0 lb

## 2021-08-15 DIAGNOSIS — R311 Benign essential microscopic hematuria: Secondary | ICD-10-CM

## 2021-08-15 DIAGNOSIS — R300 Dysuria: Secondary | ICD-10-CM | POA: Diagnosis not present

## 2021-08-15 DIAGNOSIS — K644 Residual hemorrhoidal skin tags: Secondary | ICD-10-CM | POA: Diagnosis not present

## 2021-08-15 HISTORY — DX: Residual hemorrhoidal skin tags: K64.4

## 2021-08-15 LAB — POCT URINALYSIS DIPSTICK
Bilirubin, UA: NEGATIVE
Glucose, UA: NEGATIVE
Ketones, UA: NEGATIVE
Leukocytes, UA: NEGATIVE
Nitrite, UA: NEGATIVE
Protein, UA: NEGATIVE
Spec Grav, UA: 1.015 (ref 1.010–1.025)
Urobilinogen, UA: 0.2 E.U./dL
pH, UA: 5.5 (ref 5.0–8.0)

## 2021-08-15 MED ORDER — HYDROCORTISONE 1 % EX OINT: 1.0000 "application " | TOPICAL_OINTMENT | Freq: Two times a day (BID) | CUTANEOUS | 0 refills | Status: DC

## 2021-08-15 MED ORDER — AMOXICILLIN-POT CLAVULANATE 875-125 MG PO TABS: 1.0000 | ORAL_TABLET | Freq: Two times a day (BID) | ORAL | 0 refills | Status: DC

## 2021-08-15 NOTE — Progress Notes (Signed)
This visit occurred during the SARS-CoV-2 public health emergency.  Safety protocols were in place, including screening questions prior to the visit, additional usage of staff PPE, and extensive cleaning of exam room while observing appropriate contact time as indicated for disinfecting solutions.    Sheena Brennan , June 16, 1966, 55 y.o., female MRN: [id number] Patient Care Team    Relationship Specialty Notifications Start End  Ma Hillock, DO PCP - General Family Medicine  03/15/16   Richmond Campbell, MD Consulting Physician Gastroenterology  03/19/16   Arvella Nigh, MD Consulting Physician Obstetrics and Gynecology  04/12/17     Chief Complaint  Patient presents with   Dysuria    Pt c/o urinary freq and lower abd pressure x 2 day     Subjective: Pt presents for an OV with complaints of urinary frequency and lower abdominal pressure of 2 days duration.  Patient has a history of chronic benign hematuria.  She does not have a history of kidney stones.  She has had prior UTIs in the past which were E. coli pansensitive.  She denies fever, chills, nausea or vomit.  She is tolerating p.o.  She denies any lower back pain.  She states she had a colonoscopy about 2 weeks ago and since that time she has just felt more abdominal pressure and fullness in general.  She also has noticed to have a hemorrhoid since her colonoscopy.  She has been putting Preparation H on her hemorrhoid and states that she also has a rash forming surrounding the hemorrhoid.  She denies any rectal bleeding.  She reports her bowel movements have returned to normal now.  She has had hemorrhoidal banding on a few occasions in the past.  She has follow-up with her gastroenterologist in December concerning the hemorrhoid. Depression screen [facility] 2/9 08/15/2021 04/05/2020 07/06/2019 04/21/2018 11/01/2017  Decreased Interest 0 0 0 0 0  Down, Depressed, Hopeless 0 0 0 - 0  PHQ - 2 Score 0 0 0 0 0    Allergies  Allergen Reactions    Demeclocycline Nausea And Vomiting and Other (See Comments)    BRAND NAME: "Declomycin"- antibiotic   Mushroom Extract Complex Hives and Other (See Comments)    Mushrooms = HIVES   Tetracyclines & Related Nausea And Vomiting   Social History   Social History Narrative   Married to [place]. 2 children (eric and brian)   Bachelors degree, Optometrist, employed.   Denies tobacco, drug or alcohol use.   Drinks caffeinated beverages.   Takes a daily vitamin.   Exercise routinely.   Wears her seatbelt, smoke detector in the home   Firearms in a locked cabinet In the home   Feels safe in her relationships.   Past Medical History:  Diagnosis Date   Allergy    Arthritis    Colon polyp 2017   adenoma, Dr. Earlean Shawl   COVID-19 02/2020   mild flu like symptoms.    Hemorrhoids    banded multiple times   Insomnia    Osteopenia 2015   By DEXA   Past Surgical History:  Procedure Laterality Date   BUNIONECTOMY Bilateral 2017   CESAREAN SECTION  1997, 2000   HEMORRHOID BANDING  03/2016   6th banding   SEPTOPLASTY  1981   Family History  Problem Relation Age of Onset   Osteoporosis Mother    Arthritis Mother    Breast cancer Maternal Grandmother    Heart disease Father    Allergies as  of 08/15/2021       Reactions   Demeclocycline Nausea And Vomiting, Other (See Comments)   BRAND NAME: "Declomycin"- antibiotic   Mushroom Extract Complex Hives, Other (See Comments)   Mushrooms = HIVES   Tetracyclines & Related Nausea And Vomiting        Medication List        Accurate as of August 15, 2021 12:17 PM. If you have any questions, ask your nurse or doctor.          acetaminophen 500 MG tablet Commonly known as: TYLENOL Take 500-1,000 mg by mouth every 6 (six) hours as needed for mild pain (or headaches).   alendronate 70 MG tablet Commonly known as: FOSAMAX Take 70 mg by mouth every Monday.   amoxicillin-clavulanate 875-125 MG tablet Commonly known as: AUGMENTIN Take 1  tablet by mouth 2 (two) times daily. Started by: Howard Pouch, DO   hydrocortisone 1 % ointment Apply 1 application topically 2 (two) times daily. Apply to area BID. Started by: Howard Pouch, DO   Vitamin D3 125 MCG (5000 UT) Caps Take 10,000 Units by mouth every 7 (seven) days.   zolpidem 10 MG tablet Commonly known as: AMBIEN Take 5 mg by mouth at bedtime as needed for sleep.        All past medical history, surgical history, allergies, family history, immunizations andmedications were updated in the EMR today and reviewed under the history and medication portions of their EMR.     ROS: Negative, with the exception of above mentioned in HPI   Objective:  BP 135/87   Pulse 72   Temp 98.2 F (36.8 C) (Oral)   Ht 5\' 1"  (1.549 m)   Wt 117 lb (53.1 kg)   SpO2 100%   BMI 22.11 kg/m  Body mass index is 22.11 kg/m. Gen: Afebrile. No acute distress. Nontoxic in appearance, well developed, well nourished.  HENT: AT. [place].  Eyes:Pupils Equal Round Reactive to light, Extraocular movements intact,  Conjunctiva without redness, discharge or icterus. Abd: Soft. NTND. BS present. No rebound or guarding.  GU:~1 cm external hemorrhoid with mild erythema, no thrombosis or drainage at 7 o'clock position.  Mild fine erythemic rash surrounding anus and perineum. MSK: No CVA tenderness bilaterally Skin: no rashes, purpura or petechiae.  Neuro: Normal gait. PERLA. EOMi. Alert. Oriented x3  Psych: Normal affect, dress and demeanor. Normal speech. Normal thought content and judgment.  No results found. No results found. Results for orders placed or performed in visit on 08/15/21 (from the past 24 hour(s))  POCT Urinalysis Dipstick     Status: None   Collection Time: 08/15/21 11:27 AM  Result Value Ref Range   Color, UA pale yellow    Clarity, UA clear    Glucose, UA Negative Negative   Bilirubin, UA negative    Ketones, UA negative    Spec Grav, UA 1.015 1.010 - 1.025   Blood, UA 1+     pH, UA 5.5 5.0 - 8.0   Protein, UA Negative Negative   Urobilinogen, UA 0.2 0.2 or 1.0 E.U./dL   Nitrite, UA negative    Leukocytes, UA Negative Negative   Appearance     Odor      Assessment/Plan: Sheena Brennan is a 55 y.o. female present for OV for  Dysuria/hematuria Urinalysis does not appear overtly infectious.  Will send for formal urine culture.  Elected to start Augmentin twice daily for possible UTI and coverage of hemorrhoid/colon inflamed. - POCT Urinalysis  Dipstick - Urinalysis w microscopic + reflex cultur Patient will be called with laboratory results  Inflamed external hemorrhoid Mildly inflamed enlarged hemorrhoid.  She has an appointment with her gastroenterologist scheduled for December.  Encouraged her to discontinue the Preparation H which may be causing the rash surrounding the area. Start hydrocortisone ointment prescribed today.  Hopefully this will help shrink the hemorrhoid and provide her more comfort, allowing her time until December.  Encouraged her to call her gastroenterologist if symptoms are not improving and may be they can work her in sooner.  Reviewed expectations re: course of current medical issues. Discussed self-management of symptoms. Outlined signs and symptoms indicating need for more acute intervention. Patient verbalized understanding and all questions were answered. Patient received an After-Visit Summary.    Orders Placed This Encounter  Procedures   Urinalysis w microscopic + reflex cultur   POCT Urinalysis Dipstick   Meds ordered this encounter  Medications   amoxicillin-clavulanate (AUGMENTIN) 875-125 MG tablet    Sig: Take 1 tablet by mouth 2 (two) times daily.    Dispense:  14 tablet    Refill:  0   hydrocortisone 1 % ointment    Sig: Apply 1 application topically 2 (two) times daily. Apply to area BID.    Dispense:  30 g    Refill:  0   Referral Orders  No referral(s) requested today     Note is dictated  utilizing voice recognition software. Although note has been proof read prior to signing, occasional typographical errors still can be missed. If any questions arise, please do not hesitate to call for verification.   electronically signed by:  Howard Pouch, DO  [place]

## 2021-08-15 NOTE — Patient Instructions (Addendum)
Great to see you today.  I have refilled the medication(s) we provide.   If labs were collected, we will inform you of lab results once received either by echart message or telephone call.   - echart message- for normal results that have been seen by the patient already.   - telephone call: abnormal results or if patient has not viewed results in their echart.  Hemorrhoids Hemorrhoids are swollen veins that may develop: In the butt (rectum). These are called internal hemorrhoids. Around the opening of the butt (anus). These are called external hemorrhoids. Hemorrhoids can cause pain, itching, or bleeding. Most of the time, they do not cause serious problems. They usually get better with diet changes, lifestyle changes, and other home treatments. What are the causes? This condition may be caused by: Having trouble pooping (constipation). Pushing hard (straining) to poop. Watery poop (diarrhea). Pregnancy. Being very overweight (obese). Sitting for long periods of time. Heavy lifting or other activity that causes you to strain. Anal sex. Riding a bike for a long period of time. What are the signs or symptoms? Symptoms of this condition include: Pain. Itching or soreness in the butt. Bleeding from the butt. Leaking poop. Swelling in the area. One or more lumps around the opening of your butt. How is this diagnosed? A doctor can often diagnose this condition by looking at the affected area. The doctor may also: Do an exam that involves feeling the area with a gloved hand (digital rectal exam). Examine the area inside your butt using a small tube (anoscope). Order blood tests. This may be done if you have lost a lot of blood. Have you get a test that involves looking inside the colon using a flexible tube with a camera on the end (sigmoidoscopy or colonoscopy). How is this treated? This condition can usually be treated at home. Your doctor may tell you to change what you eat, make  lifestyle changes, or try home treatments. If these do not help, procedures can be done to remove the hemorrhoids or make them smaller. These may involve: Placing rubber bands at the base of the hemorrhoids to cut off their blood supply. Injecting medicine into the hemorrhoids to shrink them. Shining a type of light energy onto the hemorrhoids to cause them to fall off. Doing surgery to remove the hemorrhoids or cut off their blood supply. Follow these instructions at home: Eating and drinking  Eat foods that have a lot of fiber in them. These include whole grains, beans, nuts, fruits, and vegetables. Ask your doctor about taking products that have added fiber (fibersupplements). Reduce the amount of fat in your diet. You can do this by: Eating low-fat dairy products. Eating less red meat. Avoiding processed foods. Drink enough fluid to keep your pee (urine) pale yellow. Managing pain and swelling  Take a warm-water bath (sitz bath) for 20 minutes to ease pain. Do this 3-4 times a day. You may do this in a bathtub or using a portable sitz bath that fits over the toilet. If told, put ice on the painful area. It may be helpful to use ice between your warm baths. Put ice in a plastic bag. Place a towel between your skin and the bag. Leave the ice on for 20 minutes, 2-3 times a day. General instructions Take over-the-counter and prescription medicines only as told by your doctor. Medicated creams and medicines may be used as told. Exercise often. Ask your doctor how much and what kind of exercise is  best for you. Go to the bathroom when you have the urge to poop. Do not wait. Avoid pushing too hard when you poop. Keep your butt dry and clean. Use wet toilet paper or moist towelettes after pooping. Do not sit on the toilet for a long time. Keep all follow-up visits as told by your doctor. This is important. Contact a doctor if you: Have pain and swelling that do not get better with  treatment or medicine. Have trouble pooping. Cannot poop. Have pain or swelling outside the area of the hemorrhoids. Get help right away if you have: Bleeding that will not stop. Summary Hemorrhoids are swollen veins in the butt or around the opening of the butt. They can cause pain, itching, or bleeding. Eat foods that have a lot of fiber in them. These include whole grains, beans, nuts, fruits, and vegetables. Take a warm-water bath (sitz bath) for 20 minutes to ease pain. Do this 3-4 times a day. This information is not intended to replace advice given to you by your health care provider. Make sure you discuss any questions you have with your health care provider. Document Revised: 10/09/2018 Document Reviewed: 02/20/2018 Elsevier Patient Education  Sheena Brennan.

## 2021-08-16 LAB — URINALYSIS W MICROSCOPIC + REFLEX CULTURE
Bacteria, UA: NONE SEEN /HPF
Bilirubin Urine: NEGATIVE
Glucose, UA: NEGATIVE
Hyaline Cast: NONE SEEN /LPF
Ketones, ur: NEGATIVE
Leukocyte Esterase: NEGATIVE
Nitrites, Initial: NEGATIVE
Protein, ur: NEGATIVE
RBC / HPF: NONE SEEN /HPF (ref 0–2)
Specific Gravity, Urine: 1.009 (ref 1.001–1.035)
Squamous Epithelial / HPF: NONE SEEN /HPF (ref <=5)
WBC, UA: NONE SEEN /HPF (ref 0–5)
pH: 5.5 (ref 5.0–8.0)

## 2021-08-16 LAB — NO CULTURE INDICATED

## 2021-09-12 DIAGNOSIS — Z6822 Body mass index (BMI) 22.0-22.9, adult: Secondary | ICD-10-CM | POA: Diagnosis not present

## 2021-09-12 DIAGNOSIS — Z01419 Encounter for gynecological examination (general) (routine) without abnormal findings: Secondary | ICD-10-CM | POA: Diagnosis not present

## 2021-09-12 DIAGNOSIS — Z1231 Encounter for screening mammogram for malignant neoplasm of breast: Secondary | ICD-10-CM | POA: Diagnosis not present

## 2021-09-12 LAB — HM PAP SMEAR: HM Pap smear: NORMAL

## 2021-09-12 LAB — RESULTS CONSOLE HPV: CHL HPV: NEGATIVE

## 2021-09-12 LAB — HM MAMMOGRAPHY

## 2021-10-04 ENCOUNTER — Other Ambulatory Visit: Payer: Self-pay

## 2021-10-04 NOTE — Patient Instructions (Signed)
°Great to see you today.  °I have refilled the medication(s) we provide.  ° °If labs were collected, we will inform you of lab results once received either by echart message or telephone call.  ° - echart message- for normal results that have been seen by the patient already.  ° - telephone call: abnormal results or if patient has not viewed results in their echart. ° °Health Maintenance, Female °Adopting a healthy lifestyle and getting preventive care are important in promoting health and wellness. Ask your health care provider about: °The right schedule for you to have regular tests and exams. °Things you can do on your own to prevent diseases and keep yourself healthy. °What should I know about diet, weight, and exercise? °Eat a healthy diet ° °Eat a diet that includes plenty of vegetables, fruits, low-fat dairy products, and lean protein. °Do not eat a lot of foods that are high in solid fats, added sugars, or sodium. °Maintain a healthy weight °Body mass index (BMI) is used to identify weight problems. It estimates body fat based on height and weight. Your health care provider can help determine your BMI and help you achieve or maintain a healthy weight. °Get regular exercise °Get regular exercise. This is one of the most important things you can do for your health. Most adults should: °Exercise for at least 150 minutes each week. The exercise should increase your heart rate and make you sweat (moderate-intensity exercise). °Do strengthening exercises at least twice a week. This is in addition to the moderate-intensity exercise. °Spend less time sitting. Even light physical activity can be beneficial. °Watch cholesterol and blood lipids °Have your blood tested for lipids and cholesterol at 57 years of age, then have this test every 5 years. °Have your cholesterol levels checked more often if: °Your lipid or cholesterol levels are high. °You are older than 57 years of age. °You are at high risk for heart  disease. °What should I know about cancer screening? °Depending on your health history and family history, you may need to have cancer screening at various ages. This may include screening for: °Breast cancer. °Cervical cancer. °Colorectal cancer. °Skin cancer. °Lung cancer. °What should I know about heart disease, diabetes, and high blood pressure? °Blood pressure and heart disease °High blood pressure causes heart disease and increases the risk of stroke. This is more likely to develop in people who have high blood pressure readings or are overweight. °Have your blood pressure checked: °Every 3-5 years if you are 18-39 years of age. °Every year if you are 40 years old or older. °Diabetes °Have regular diabetes screenings. This checks your fasting blood sugar level. Have the screening done: °Once every three years after age 40 if you are at a normal weight and have a low risk for diabetes. °More often and at a younger age if you are overweight or have a high risk for diabetes. °What should I know about preventing infection? °Hepatitis B °If you have a higher risk for hepatitis B, you should be screened for this virus. Talk with your health care provider to find out if you are at risk for hepatitis B infection. °Hepatitis C °Testing is recommended for: °Everyone born from 1945 through 1965. °Anyone with known risk factors for hepatitis C. °Sexually transmitted infections (STIs) °Get screened for STIs, including gonorrhea and chlamydia, if: °You are sexually active and are younger than 57 years of age. °You are older than 57 years of age and your health care provider   tells you that you are at risk for this type of infection. °Your sexual activity has changed since you were last screened, and you are at increased risk for chlamydia or gonorrhea. Ask your health care provider if you are at risk. °Ask your health care provider about whether you are at high risk for HIV. Your health care provider may recommend a  prescription medicine to help prevent HIV infection. If you choose to take medicine to prevent HIV, you should first get tested for HIV. You should then be tested every 3 months for as long as you are taking the medicine. °Pregnancy °If you are about to stop having your period (premenopausal) and you may become pregnant, seek counseling before you get pregnant. °Take 400 to 800 micrograms (mcg) of folic acid every day if you become pregnant. °Ask for birth control (contraception) if you want to prevent pregnancy. °Osteoporosis and menopause °Osteoporosis is a disease in which the bones lose minerals and strength with aging. This can result in bone fractures. If you are 65 years old or older, or if you are at risk for osteoporosis and fractures, ask your health care provider if you should: °Be screened for bone loss. °Take a calcium or vitamin D supplement to lower your risk of fractures. °Be given hormone replacement therapy (HRT) to treat symptoms of menopause. °Follow these instructions at home: °Alcohol use °Do not drink alcohol if: °Your health care provider tells you not to drink. °You are pregnant, may be pregnant, or are planning to become pregnant. °If you drink alcohol: °Limit how much you have to: °0-1 drink a day. °Know how much alcohol is in your drink. In the [place] one drink equals one 12 oz bottle of beer (355 mL), one 5 oz glass of wine (148 mL), or one 1½ oz glass of hard liquor (44 mL). °Lifestyle °Do not use any products that contain nicotine or tobacco. These products include cigarettes, chewing tobacco, and vaping devices, such as e-cigarettes. If you need help quitting, ask your health care provider. °Do not use street drugs. °Do not share needles. °Ask your health care provider for help if you need support or information about quitting drugs. °General instructions °Schedule regular health, dental, and eye exams. °Stay current with your vaccines. °Tell your health care provider if: °You often  feel depressed. °You have ever been abused or do not feel safe at home. °Summary °Adopting a healthy lifestyle and getting preventive care are important in promoting health and wellness. °Follow your health care provider's instructions about healthy diet, exercising, and getting tested or screened for diseases. °Follow your health care provider's instructions on monitoring your cholesterol and blood pressure. °This information is not intended to replace advice given to you by your health care provider. Make sure you discuss any questions you have with your health care provider. °Document Revised: 02/20/2021 Document Reviewed: 02/20/2021 °Elsevier Patient Education © 2022 [employer] ° °

## 2021-10-04 NOTE — Progress Notes (Signed)
This visit occurred during the SARS-CoV-2 public health emergency.  Safety protocols were in place, including screening questions prior to the visit, additional usage of staff PPE, and extensive cleaning of exam room while observing appropriate contact time as indicated for disinfecting solutions.    Patient ID: Sheena Brennan, female  DOB: [birth year], 55 y.o.   MRN: [id number] Patient Care Team    Relationship Specialty Notifications Start End  Ma Hillock, DO PCP - General Family Medicine  03/15/16   Richmond Campbell, MD Consulting Physician Gastroenterology  03/19/16   Arvella Nigh, MD Consulting Physician Obstetrics and Gynecology  04/12/17     Chief Complaint  Patient presents with   Annual Exam    Pt is fasting    Subjective: Sheena Brennan is a 55 y.o.  Female  present for CPE  All past medical history, surgical history, allergies, family history, immunizations, medications and social history were updated in the electronic medical record today. All recent labs, ED visits and hospitalizations within the last year were reviewed.  Health maintenance:  Colonoscopy: every 5 years for polyps, Dr. Earlean Shawl, UTD 07/2021. Mammogram:UTD with GYN Cervical cancer screening: UTD with GYN [facility] Immunizations: tdap UTD 2017, yearly flu shot encouraged declined. Shingrix declined. Covid declined for now.  Infectious disease screening: HIV and Hep C completed DEXA:osteopenia (-2.0) 2015, taking vit D, not calcium.  fosamax>>scheduled GYN Assistive device: none Oxygen QBH:ALPF Patient has a Dental home. Hospitalizations/ED visits: reviewed  Depression screen [facility] 2/9 10/05/2021 08/15/2021 04/05/2020 07/06/2019 04/21/2018  Decreased Interest 0 0 0 0 0  Down, Depressed, Hopeless 0 0 0 0 -  PHQ - 2 Score 0 0 0 0 0   No flowsheet data found.   Immunization History  Administered Date(s) Administered   Influenza-Unspecified 07/16/2015, 07/15/2018   Tdap 04/09/2016    Past Medical History:   Diagnosis Date   Allergy    Arthritis    Colon polyp 2017   adenoma, Dr. Earlean Shawl   COVID-19 02/2020   mild flu like symptoms.    Hemorrhoids    banded multiple times   Insomnia    Osteopenia 2015   By DEXA   Allergies  Allergen Reactions   Demeclocycline Nausea And Vomiting and Other (See Comments)    BRAND NAME: "Declomycin"- antibiotic   Mushroom Extract Complex Hives and Other (See Comments)    Mushrooms = HIVES   Tetracyclines & Related Nausea And Vomiting   Past Surgical History:  Procedure Laterality Date   BUNIONECTOMY Bilateral 2017   CESAREAN SECTION  1997, 2000   HEMORRHOID BANDING  03/2016   6th banding   SEPTOPLASTY  1981   Family History  Problem Relation Age of Onset   Osteoporosis Mother    Arthritis Mother    Breast cancer Maternal Grandmother    Heart disease Father    Social History   Social History Narrative   Married to [place]. 2 children (eric and brian)   Bachelors degree, Optometrist, employed.   Denies tobacco, drug or alcohol use.   Drinks caffeinated beverages.   Takes a daily vitamin.   Exercise routinely.   Wears her seatbelt, smoke detector in the home   Firearms in a locked cabinet In the home   Feels safe in her relationships.    Allergies as of 10/05/2021       Reactions   Demeclocycline Nausea And Vomiting, Other (See Comments)   BRAND NAME: "Declomycin"- antibiotic   Mushroom Extract Complex Hives, Other (See  Comments)   Mushrooms = HIVES   Tetracyclines & Related Nausea And Vomiting        Medication List        Accurate as of October 05, 2021  9:15 AM. If you have any questions, ask your nurse or doctor.          STOP taking these medications    amoxicillin-clavulanate 875-125 MG tablet Commonly known as: AUGMENTIN Stopped by: Howard Pouch, DO       TAKE these medications    acetaminophen 500 MG tablet Commonly known as: TYLENOL Take 500-1,000 mg by mouth every 6 (six) hours as needed for mild pain  (or headaches).   alendronate 70 MG tablet Commonly known as: FOSAMAX Take 70 mg by mouth every Monday.   hydrocortisone 1 % ointment Apply 1 application topically 2 (two) times daily. Apply to area BID.   Vitamin D3 125 MCG (5000 UT) Caps Take 10,000 Units by mouth every 7 (seven) days.   zolpidem 10 MG tablet Commonly known as: AMBIEN Take 5 mg by mouth at bedtime as needed for sleep.        All past medical history, surgical history, allergies, family history, immunizations andmedications were updated in the EMR today and reviewed under the history and medication portions of their EMR.       ROS 14 pt review of systems performed and negative (unless mentioned in an HPI)  Objective:  BP 125/72    Pulse 68    Temp 98.4 F (36.9 C) (Oral)    Ht 5\' 1"  (1.549 m)    Wt 115 lb (52.2 kg)    SpO2 100%    BMI 21.73 kg/m   Physical Exam Vitals and nursing note reviewed.  Constitutional:      General: She is not in acute distress.    Appearance: Normal appearance. She is normal weight. She is not ill-appearing or toxic-appearing.  HENT:     Head: Normocephalic and atraumatic.     Right Ear: Tympanic membrane, ear canal and external ear normal. There is no impacted cerumen.     Left Ear: Tympanic membrane, ear canal and external ear normal. There is no impacted cerumen.     Nose: No congestion or rhinorrhea.     Mouth/Throat:     Mouth: Mucous membranes are moist.     Pharynx: Oropharynx is clear. No oropharyngeal exudate or posterior oropharyngeal erythema.  Eyes:     General: No scleral icterus.       Right eye: No discharge.        Left eye: No discharge.     Extraocular Movements: Extraocular movements intact.     Conjunctiva/sclera: Conjunctivae normal.     Pupils: Pupils are equal, round, and reactive to light.  Cardiovascular:     Rate and Rhythm: Normal rate and regular rhythm.     Pulses: Normal pulses.     Heart sounds: Normal heart sounds. No murmur heard.    No friction rub. No gallop.  Pulmonary:     Effort: Pulmonary effort is normal. No respiratory distress.     Breath sounds: Normal breath sounds. No stridor. No wheezing, rhonchi or rales.  Chest:     Chest wall: No tenderness.  Abdominal:     General: Abdomen is flat. Bowel sounds are normal. There is no distension.     Palpations: Abdomen is soft. There is no mass.     Tenderness: There is no abdominal tenderness. There is no right  CVA tenderness, left CVA tenderness, guarding or rebound.     Hernia: No hernia is present.  Musculoskeletal:        General: No swelling, tenderness or deformity. Normal range of motion.     Cervical back: Normal range of motion and neck supple. No rigidity or tenderness.     Right lower leg: No edema.     Left lower leg: No edema.  Lymphadenopathy:     Cervical: No cervical adenopathy.  Skin:    General: Skin is warm and dry.     Coloration: Skin is not jaundiced or pale.     Findings: No bruising, erythema, lesion or rash.  Neurological:     General: No focal deficit present.     Mental Status: She is alert and oriented to person, place, and time. Mental status is at baseline.     Cranial Nerves: No cranial nerve deficit.     Sensory: No sensory deficit.     Motor: No weakness.     Coordination: Coordination normal.     Gait: Gait normal.     Deep Tendon Reflexes: Reflexes normal.  Psychiatric:        Mood and Affect: Mood normal.        Behavior: Behavior normal.        Thought Content: Thought content normal.        Judgment: Judgment normal.     No results found.  Assessment/plan: FIZA NATION is a 55 y.o. female present for CPE  Encounter for long-term (current) use of medications - Comprehensive metabolic panel Osteopenia, unspecified location 08/2019 pt reported-at gyn-due 2025 if gyn has not repeated Ca/vitd recs Diabetes mellitus screening - Hemoglobin A1c Lipid screening - Lipid panel Screening for deficiency anemia -  CBC Routine general medical examination at a health care facility Colonoscopy: every 5 years for polyps, Dr. Earlean Shawl, UTD 07/2021. Mammogram:UTD with GYN Cervical cancer screening: UTD with GYN [facility] Immunizations: tdap UTD 2017, yearly flu shot encouraged declined. Shingrix declined. Covid declined for now.  Infectious disease screening: HIV and Hep C completed DEXA:osteopenia (-2.0) 2015, taking vit D, not calcium.  fosamax>>scheduled GYN Patient was encouraged to exercise greater than 150 minutes a week. Patient was encouraged to choose a diet filled with fresh fruits and vegetables, and lean meats. AVS provided to patient today for education/recommendation on gender specific health and safety maintenance.  Return in about 1 year (around 10/06/2022) for CPE (30 min).  Orders Placed This Encounter  Procedures   CBC   Comprehensive metabolic panel   Hemoglobin A1c   Lipid panel   TSH   Vitamin D (25 hydroxy)   No orders of the defined types were placed in this encounter.  Referral Orders  No referral(s) requested today     Electronically signed by: Howard Pouch, [place]

## 2021-10-05 ENCOUNTER — Encounter: Payer: Self-pay | Admitting: Family Medicine

## 2021-10-05 ENCOUNTER — Ambulatory Visit (INDEPENDENT_AMBULATORY_CARE_PROVIDER_SITE_OTHER): Payer: BC Managed Care – PPO | Admitting: Family Medicine

## 2021-10-05 VITALS — BP 125/72 | HR 68 | Temp 98.4°F | Ht 61.0 in | Wt 115.0 lb

## 2021-10-05 DIAGNOSIS — M8580 Other specified disorders of bone density and structure, unspecified site: Secondary | ICD-10-CM | POA: Diagnosis not present

## 2021-10-05 DIAGNOSIS — Z0000 Encounter for general adult medical examination without abnormal findings: Secondary | ICD-10-CM | POA: Diagnosis not present

## 2021-10-05 DIAGNOSIS — Z131 Encounter for screening for diabetes mellitus: Secondary | ICD-10-CM

## 2021-10-05 DIAGNOSIS — Z13220 Encounter for screening for lipoid disorders: Secondary | ICD-10-CM | POA: Diagnosis not present

## 2021-10-05 DIAGNOSIS — Z130 Encounter for screening for diseases of the blood and blood-forming organs and certain disorders involving the immune mechanism: Secondary | ICD-10-CM | POA: Diagnosis not present

## 2021-10-05 DIAGNOSIS — Z79899 Other long term (current) drug therapy: Secondary | ICD-10-CM | POA: Diagnosis not present

## 2021-10-05 LAB — COMPREHENSIVE METABOLIC PANEL
ALT: 13 U/L (ref 0–35)
AST: 20 U/L (ref 0–37)
Albumin: 4.4 g/dL (ref 3.5–5.2)
Alkaline Phosphatase: 43 U/L (ref 39–117)
BUN: 20 mg/dL (ref 6–23)
CO2: 29 mEq/L (ref 19–32)
Calcium: 9.6 mg/dL (ref 8.4–10.5)
Chloride: 104 mEq/L (ref 96–112)
Creatinine, Ser: 0.72 mg/dL (ref 0.40–1.20)
GFR: 92.51 mL/min (ref >60.00)
Glucose, Bld: 84 mg/dL (ref 70–99)
Potassium: 3.7 mEq/L (ref 3.5–5.1)
Sodium: 140 mEq/L (ref 135–145)
Total Bilirubin: 1.5 mg/dL — ABNORMAL HIGH (ref 0.2–1.2)
Total Protein: 6.8 g/dL (ref 6.0–8.3)

## 2021-10-05 LAB — CBC
HCT: 40.7 % (ref 36.0–46.0)
Hemoglobin: 13.8 g/dL (ref 12.0–15.0)
MCHC: 33.9 g/dL (ref 30.0–36.0)
MCV: 91.3 fl (ref 78.0–100.0)
Platelets: 184 10*3/uL (ref 150.0–400.0)
RBC: 4.46 Mil/uL (ref 3.87–5.11)
RDW: 13.5 % (ref 11.5–15.5)
WBC: 4 10*3/uL (ref 4.0–10.5)

## 2021-10-05 LAB — TSH: TSH: 2.07 u[IU]/mL (ref 0.35–5.50)

## 2021-10-05 LAB — VITAMIN D 25 HYDROXY (VIT D DEFICIENCY, FRACTURES): VITD: 31.24 ng/mL (ref 30.00–100.00)

## 2021-10-05 LAB — LIPID PANEL
Cholesterol: 201 mg/dL — ABNORMAL HIGH (ref 0–200)
HDL: 64.6 mg/dL (ref >39.00)
LDL Cholesterol: 122 mg/dL — ABNORMAL HIGH (ref 0–99)
NonHDL: 136.65
Total CHOL/HDL Ratio: 3
Triglycerides: 74 mg/dL (ref 0.0–149.0)
VLDL: 14.8 mg/dL (ref 0.0–40.0)

## 2021-10-05 LAB — HEMOGLOBIN A1C: Hgb A1c MFr Bld: 5.5 % (ref 4.6–6.5)

## 2021-12-01 ENCOUNTER — Ambulatory Visit: Payer: BC Managed Care – PPO | Admitting: Family Medicine

## 2021-12-01 ENCOUNTER — Encounter: Payer: Self-pay | Admitting: Family Medicine

## 2021-12-01 ENCOUNTER — Other Ambulatory Visit: Payer: Self-pay

## 2021-12-01 VITALS — BP 113/77 | HR 87 | Temp 99.1°F | Ht 61.0 in | Wt 115.0 lb

## 2021-12-01 DIAGNOSIS — B9689 Other specified bacterial agents as the cause of diseases classified elsewhere: Secondary | ICD-10-CM | POA: Diagnosis not present

## 2021-12-01 DIAGNOSIS — J329 Chronic sinusitis, unspecified: Secondary | ICD-10-CM | POA: Diagnosis not present

## 2021-12-01 DIAGNOSIS — R0981 Nasal congestion: Secondary | ICD-10-CM

## 2021-12-01 LAB — POCT INFLUENZA A/B
Influenza A, POC: NEGATIVE
Influenza B, POC: NEGATIVE

## 2021-12-01 LAB — POCT RAPID STREP A (OFFICE): Rapid Strep A Screen: NEGATIVE

## 2021-12-01 LAB — POC COVID19 BINAXNOW: SARS Coronavirus 2 Ag: NEGATIVE

## 2021-12-01 MED ORDER — AMOXICILLIN-POT CLAVULANATE 875-125 MG PO TABS: 1.0000 | ORAL_TABLET | Freq: Two times a day (BID) | ORAL | 0 refills | Status: DC

## 2021-12-01 NOTE — Progress Notes (Signed)
This visit occurred during the SARS-CoV-2 public health emergency.  Safety protocols were in place, including screening questions prior to the visit, additional usage of staff PPE, and extensive cleaning of exam room while observing appropriate contact time as indicated for disinfecting solutions.    Sheena Brennan , 1965-11-18, 56 y.o., female MRN: [id number] Patient Care Team    Relationship Specialty Notifications Start End  Ma Hillock, DO PCP - General Family Medicine  03/15/16   Richmond Campbell, MD Consulting Physician Gastroenterology  03/19/16   Arvella Nigh, MD Consulting Physician Obstetrics and Gynecology  04/12/17     Chief Complaint  Patient presents with   Nasal Congestion    Pt c/o nasal congestion, HA, facial pain, neck pain, x 2 days;      Subjective: Pt presents for an OV with complaints of nasal congestion, headache, facial pain and "neck pain.  "She states the neck pain is bilateral sides and points to lymph chains.  She states she believes she had a fever yesterday by the way she felt.  She denies chills.  She is slightly fatigued.  Declined influenza and COVID vaccines.  Immunization History  Administered Date(s) Administered   Influenza-Unspecified 07/16/2015, 07/15/2018   Tdap 04/09/2016     Depression screen [facility] 2/9 10/05/2021 08/15/2021 04/05/2020 07/06/2019 04/21/2018  Decreased Interest 0 0 0 0 0  Down, Depressed, Hopeless 0 0 0 0 -  PHQ - 2 Score 0 0 0 0 0    Allergies  Allergen Reactions   Demeclocycline Nausea And Vomiting and Other (See Comments)    BRAND NAME: "Declomycin"- antibiotic   Mushroom Extract Complex Hives and Other (See Comments)    Mushrooms = HIVES   Tetracyclines & Related Nausea And Vomiting   Social History   Social History Narrative   Married to [place]. 2 children (eric and brian)   Bachelors degree, Optometrist, employed.   Denies tobacco, drug or alcohol use.   Drinks caffeinated beverages.   Takes a daily vitamin.    Exercise routinely.   Wears her seatbelt, smoke detector in the home   Firearms in a locked cabinet In the home   Feels safe in her relationships.   Past Medical History:  Diagnosis Date   Allergy    Arthritis    Colon polyp 2017   adenoma, Dr. Earlean Shawl   COVID-19 02/2020   mild flu like symptoms.    Hemorrhoids    banded multiple times   Insomnia    Osteopenia 2015   By DEXA   Past Surgical History:  Procedure Laterality Date   BUNIONECTOMY Bilateral 2017   CESAREAN SECTION  1997, 2000   HEMORRHOID BANDING  03/2016   6th banding   SEPTOPLASTY  1981   Family History  Problem Relation Age of Onset   Osteoporosis Mother    Arthritis Mother    Breast cancer Maternal Grandmother    Heart disease Father    Allergies as of 12/01/2021       Reactions   Demeclocycline Nausea And Vomiting, Other (See Comments)   BRAND NAME: "Declomycin"- antibiotic   Mushroom Extract Complex Hives, Other (See Comments)   Mushrooms = HIVES   Tetracyclines & Related Nausea And Vomiting        Medication List        Accurate as of December 01, 2021 12:24 PM. If you have any questions, ask your nurse or doctor.          acetaminophen  500 MG tablet Commonly known as: TYLENOL Take 500-1,000 mg by mouth every 6 (six) hours as needed for mild pain (or headaches).   alendronate 70 MG tablet Commonly known as: FOSAMAX Take 70 mg by mouth every Monday.   amoxicillin-clavulanate 875-125 MG tablet Commonly known as: AUGMENTIN Take 1 tablet by mouth 2 (two) times daily. Started by: Howard Pouch, DO   hydrocortisone 1 % ointment Apply 1 application topically 2 (two) times daily. Apply to area BID.   Vitamin D3 125 MCG (5000 UT) Caps Take 10,000 Units by mouth every 7 (seven) days.   zolpidem 10 MG tablet Commonly known as: AMBIEN Take 5 mg by mouth at bedtime as needed for sleep.        All past medical history, surgical history, allergies, family history, immunizations  andmedications were updated in the EMR today and reviewed under the history and medication portions of their EMR.     Review of Systems  Constitutional:  Positive for malaise/fatigue. Negative for chills and fever.  HENT:  Positive for congestion, sinus pain and sore throat.   Eyes:  Negative for discharge and redness.  Respiratory:  Negative for shortness of breath and wheezing.   Musculoskeletal:  Positive for neck pain.  Skin:  Negative for rash.  Neurological:  Positive for headaches. Negative for dizziness and weakness.  Negative, with the exception of above mentioned in HPI   Objective:  BP 113/77    Pulse 87    Temp 99.1 F (37.3 C) (Oral)    Ht 5\' 1"  (1.549 m)    Wt 115 lb (52.2 kg)    SpO2 98%    BMI 21.73 kg/m  Body mass index is 21.73 kg/m. Physical Exam Constitutional:      Appearance: Normal appearance.  HENT:     Head: Normocephalic and atraumatic.     Comments: Tender to palpation over right maxillary sinus.    Right Ear: Tympanic membrane, ear canal and external ear normal. There is no impacted cerumen.     Left Ear: Tympanic membrane, ear canal and external ear normal. There is no impacted cerumen.     Nose: Congestion and rhinorrhea present.     Mouth/Throat:     Mouth: Mucous membranes are moist.     Pharynx: Posterior oropharyngeal erythema present. No oropharyngeal exudate.  Eyes:     Extraocular Movements: Extraocular movements intact.     Conjunctiva/sclera: Conjunctivae normal.     Pupils: Pupils are equal, round, and reactive to light.  Neurological:     Mental Status: She is alert.    No results found. No results found. Results for orders placed or performed in visit on 12/01/21 (from the past 24 hour(s))  POCT rapid strep A     Status: Normal   Collection Time: 12/01/21 11:55 AM  Result Value Ref Range   Rapid Strep A Screen Negative Negative  POC COVID-19 BinaxNow     Status: Normal   Collection Time: 12/01/21 11:55 AM  Result Value Ref  Range   SARS Coronavirus 2 Ag Negative Negative  POCT Influenza A/B     Status: Normal   Collection Time: 12/01/21 11:56 AM  Result Value Ref Range   Influenza A, POC Negative Negative   Influenza B, POC Negative Negative    Assessment/Plan: Sheena Brennan is a 56 y.o. female present for OV for  Bacterial sinusitis/ Nasal congestion Point-of-care testing was negative for influenza, COVID and strep today.  She has a significant amount  of right sinus pressure to palpation today.  She also has a low-grade fever yesterday.  Suspect early bacterial sinusitis. Encouraged her to flush nasal passages with nasal saline 2-3 times daily.  Apply Flonase x1 daily Augmentin twice daily was printed/prescribed for her in the event her symptoms worsen over the weekend, or do not improve.  In the meantime she will treat with over-the-counter supportive care.  Patient reports understanding of instructions. - POCT Influenza A/B - POCT rapid strep A - POC COVID-19 BinaxNow  Reviewed expectations re: course of current medical issues. Discussed self-management of symptoms. Outlined signs and symptoms indicating need for more acute intervention. Patient verbalized understanding and all questions were answered. Patient received an After-Visit Summary.    Orders Placed This Encounter  Procedures   POCT Influenza A/B   POCT rapid strep A   POC COVID-19 BinaxNow   Meds ordered this encounter  Medications   amoxicillin-clavulanate (AUGMENTIN) 875-125 MG tablet    Sig: Take 1 tablet by mouth 2 (two) times daily.    Dispense:  20 tablet    Refill:  0   Referral Orders  No referral(s) requested today     Note is dictated utilizing voice recognition software. Although note has been proof read prior to signing, occasional typographical errors still can be missed. If any questions arise, please do not hesitate to call for verification.   electronically signed by:  Howard Pouch, DO  [place]

## 2021-12-01 NOTE — Patient Instructions (Addendum)

## 2022-03-06 IMAGING — MR MR HEAD W/O CM
9 of 10 series · 38 of 48 positions shown · non-contrast
Comparison: No pertinent prior exams are available for comparison.

CLINICAL DATA: Dizziness, nonspecific.

EXAM:
MRI HEAD WITHOUT CONTRAST
TECHNIQUE: Multiplanar, multiecho pulse sequences of the brain and surrounding
structures were obtained without intravenous contrast.

[Series 3: DWI · axial · 3.0mm · 1.09mm/px · z∈[-57,+82]mm · 11 of 96 slices shown (1 of 4)]
[im 1/96]
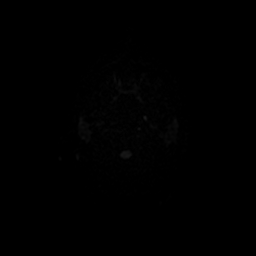
[im 10/96]
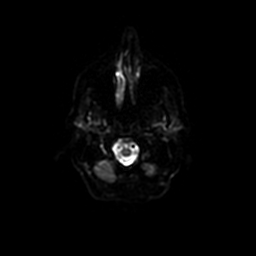
[im 20/96]
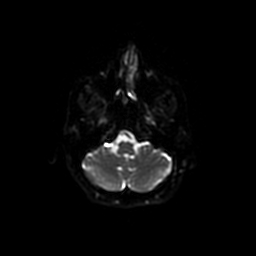
[im 29/96]
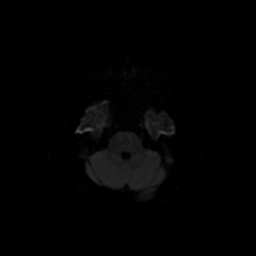
[im 39/96]
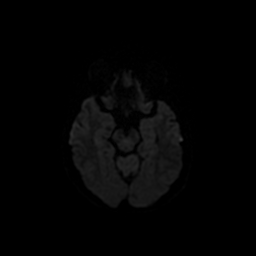
[im 48/96]
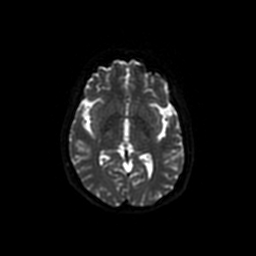
[im 58/96]
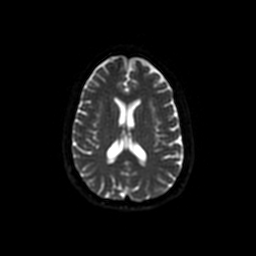
[im 67/96]
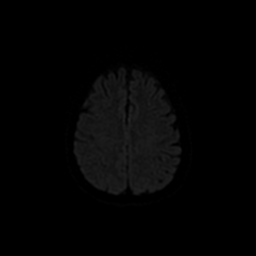
[im 77/96]
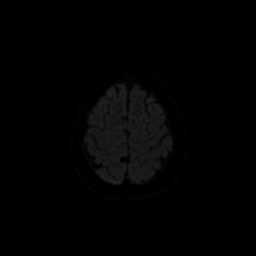
[im 86/96]
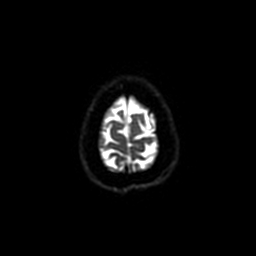
[im 96/96]
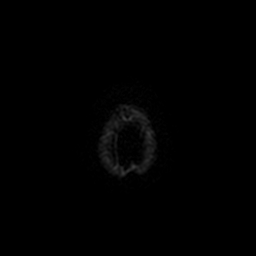

[Series 4: DWI · coronal · 5.0mm · 1.09mm/px · 7 of 72 slices shown (2 of 4)]
[im 1/72]
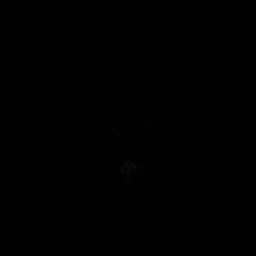
[im 12/72]
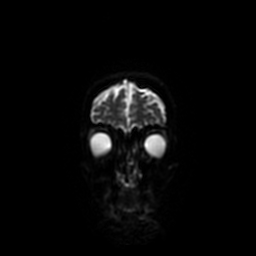
[im 24/72]
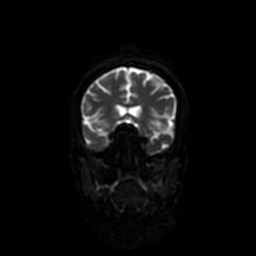
[im 36/72]
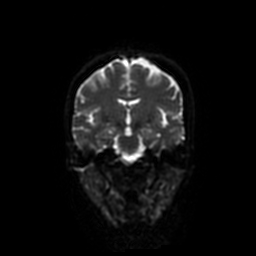
[im 48/72]
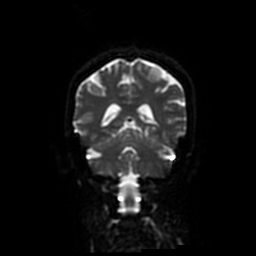
[im 60/72]
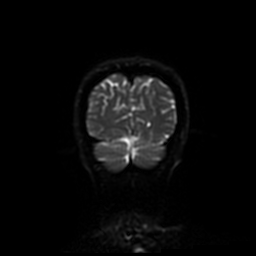
[im 72/72]
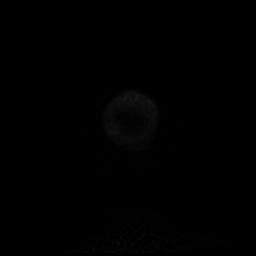

[Series 5: T1 · sagittal · 5.0mm · 0.47mm/px · 2 of 23 slices shown (1 of 2)]
[im 1/23]
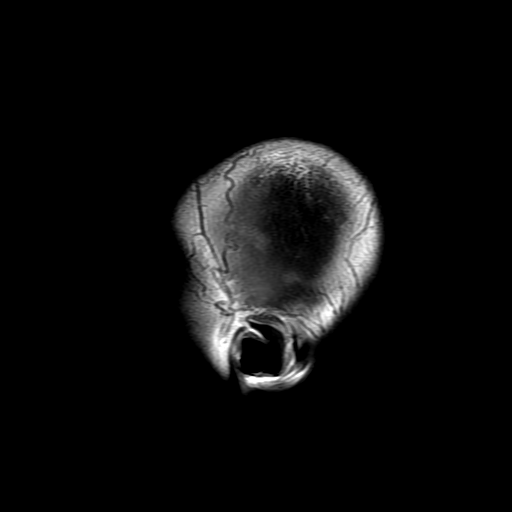
[im 23/23]
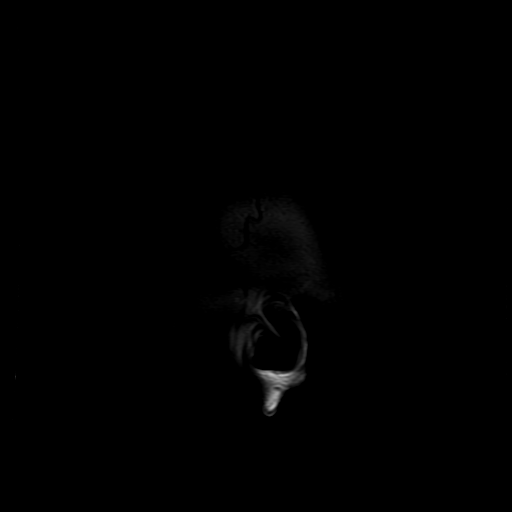

[Series 6: T2 · axial · 5.0mm · 0.43mm/px · z∈[-47,+90]mm · 2 of 24 slices shown (1 of 2)]
[im 1/24]
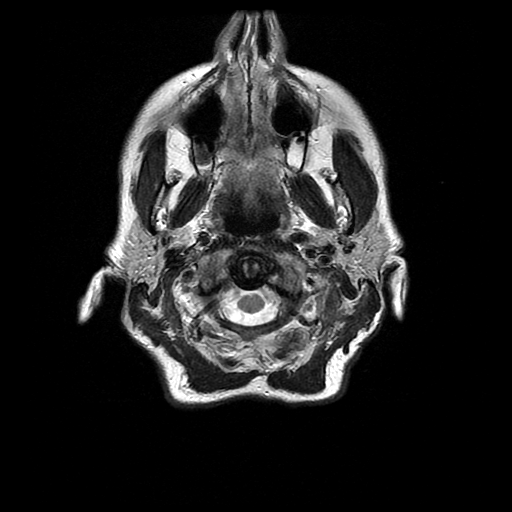
[im 24/24]
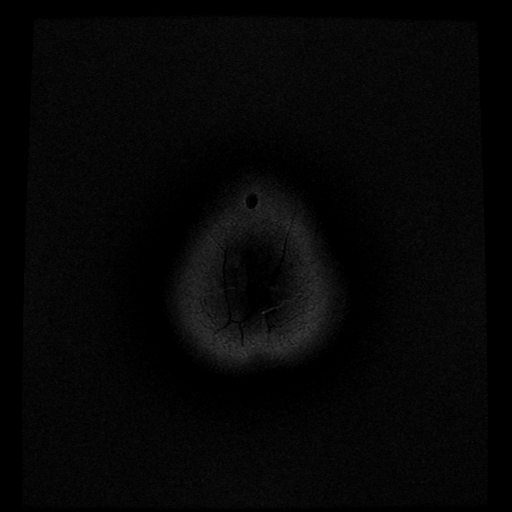

[Series 7: FLAIR · axial · 3.0mm · 0.43mm/px · z∈[-47,+90]mm · 2 of 24 slices shown]
[im 1/24]
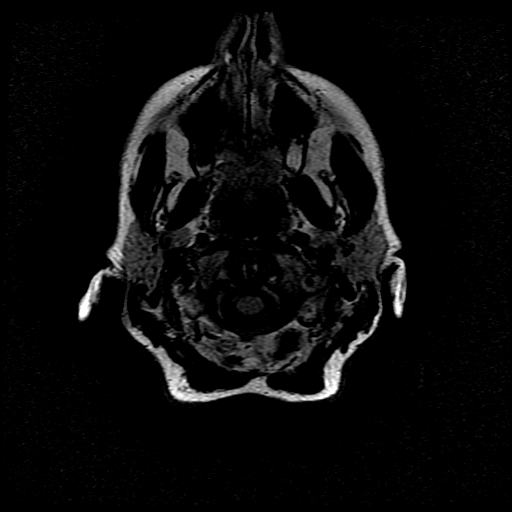
[im 24/24]
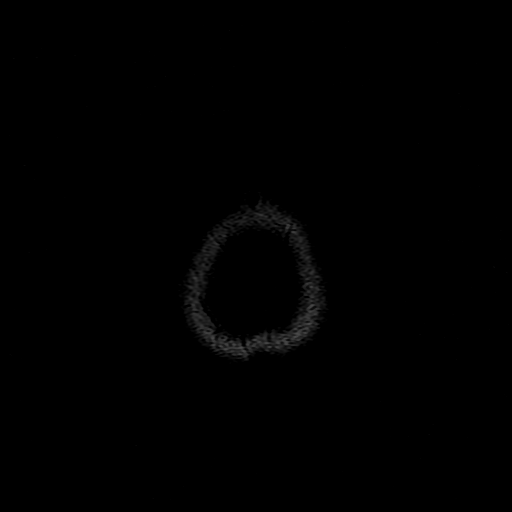

[Series 9: T1 · axial · 3.0mm · 0.47mm/px · z∈[-51,-34]mm · 2 of 100 slices shown (2 of 2)]
[im 1/100]
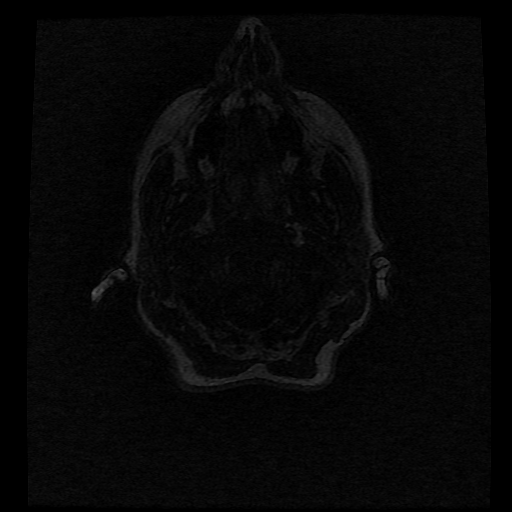
[im 12/100]
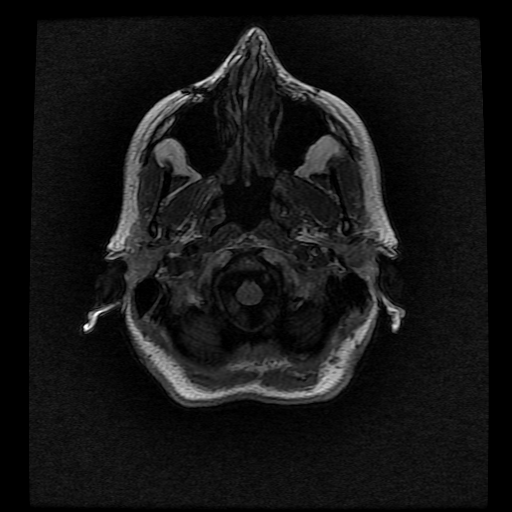

[Series 10: T2 · coronal · 5.0mm · 0.39mm/px · 3 of 27 slices shown (2 of 2)]
[im 1/27]
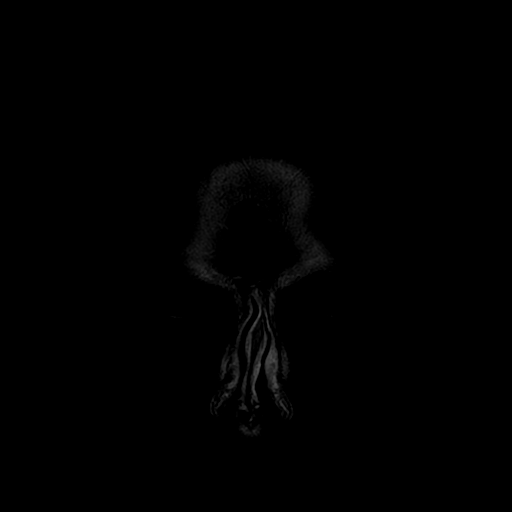
[im 14/27]
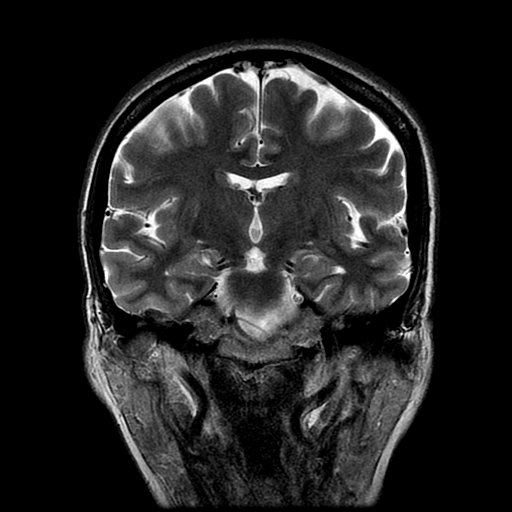
[im 27/27]
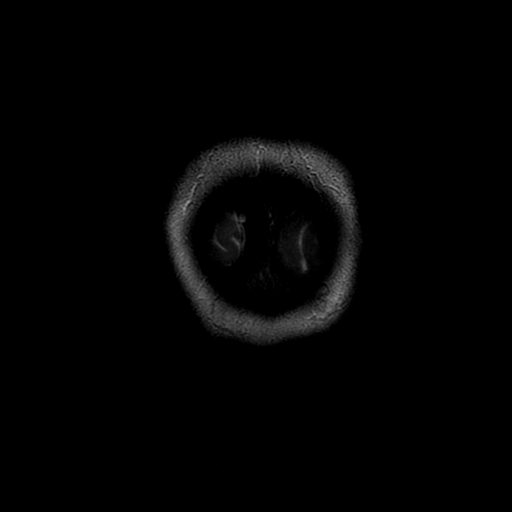

[Series 300: DWI · axial · 3.0mm · 1.09mm/px · z∈[-57,+82]mm · 5 of 48 slices shown (3 of 4)]
[im 1/48]
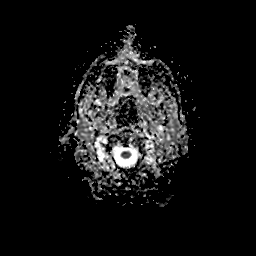
[im 12/48]
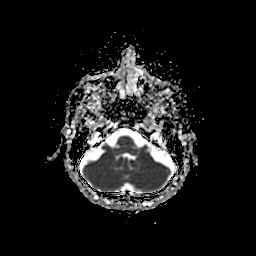
[im 24/48]
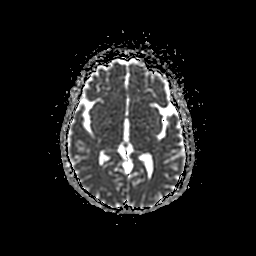
[im 36/48]
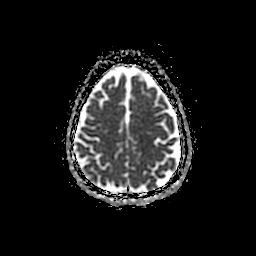
[im 48/48]
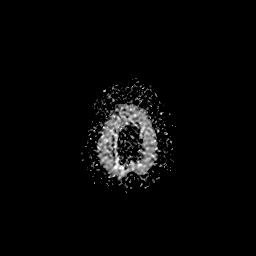

[Series 400: DWI · coronal · 5.0mm · 1.09mm/px · 4 of 35 slices shown (4 of 4)]
[im 1/35]
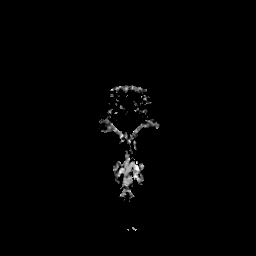
[im 12/35]
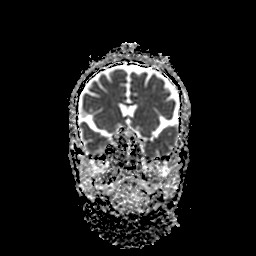
[im 23/35]
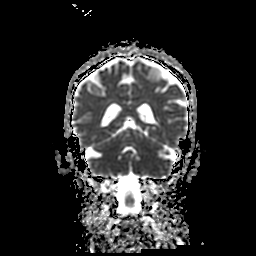
[im 35/35]
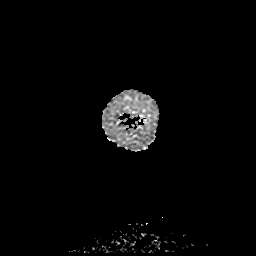

[38 of 48 positions shown; findings below may reference images not displayed]

FINDINGS: Brain:

Cerebral volume is normal.

No focal parenchymal signal abnormality is identified.

There is no acute infarct.

No evidence of intracranial mass.

No chronic intracranial blood products.

No extra-axial fluid collection.

No midline shift.

Vascular: Expected proximal arterial flow voids.

Skull and upper cervical spine: No focal marrow lesion.

Sinuses/Orbits: Visualized orbits show no acute finding. 10 mm
mucous retention cyst or polyps within the superomedial left
maxillary sinus/ethmoid infundibulum. No significant mastoid
effusion.
IMPRESSION: Unremarkable non-contrast MRI appearance of the brain. No evidence
of acute intracranial abnormality.

10 mm mucous retention cysts or polyp within the superomedial left
maxillary sinus/ethmoid infundibulum.

## 2022-09-28 ENCOUNTER — Encounter: Payer: Self-pay | Admitting: Family Medicine

## 2022-09-28 ENCOUNTER — Ambulatory Visit (INDEPENDENT_AMBULATORY_CARE_PROVIDER_SITE_OTHER): Payer: 59 | Admitting: Family Medicine

## 2022-09-28 VITALS — BP 120/69 | HR 67 | Temp 98.4°F | Ht 61.25 in | Wt 116.0 lb

## 2022-09-28 DIAGNOSIS — Z79899 Other long term (current) drug therapy: Secondary | ICD-10-CM

## 2022-09-28 DIAGNOSIS — R319 Hematuria, unspecified: Secondary | ICD-10-CM

## 2022-09-28 DIAGNOSIS — Z13220 Encounter for screening for lipoid disorders: Secondary | ICD-10-CM | POA: Diagnosis not present

## 2022-09-28 DIAGNOSIS — Z0000 Encounter for general adult medical examination without abnormal findings: Secondary | ICD-10-CM

## 2022-09-28 DIAGNOSIS — M8580 Other specified disorders of bone density and structure, unspecified site: Secondary | ICD-10-CM | POA: Diagnosis not present

## 2022-09-28 DIAGNOSIS — Z130 Encounter for screening for diseases of the blood and blood-forming organs and certain disorders involving the immune mechanism: Secondary | ICD-10-CM

## 2022-09-28 DIAGNOSIS — Z131 Encounter for screening for diabetes mellitus: Secondary | ICD-10-CM | POA: Diagnosis not present

## 2022-09-28 DIAGNOSIS — Z2821 Immunization not carried out because of patient refusal: Secondary | ICD-10-CM

## 2022-09-28 LAB — LIPID PANEL
Cholesterol: 188 mg/dL (ref 0–200)
HDL: 57.9 mg/dL (ref >39.00)
LDL Cholesterol: 109 mg/dL — ABNORMAL HIGH (ref 0–99)
NonHDL: 130.58
Total CHOL/HDL Ratio: 3
Triglycerides: 110 mg/dL (ref 0.0–149.0)
VLDL: 22 mg/dL (ref 0.0–40.0)

## 2022-09-28 LAB — CBC
HCT: 40.9 % (ref 36.0–46.0)
Hemoglobin: 14.1 g/dL (ref 12.0–15.0)
MCHC: 34.5 g/dL (ref 30.0–36.0)
MCV: 90.8 fl (ref 78.0–100.0)
Platelets: 184 10*3/uL (ref 150.0–400.0)
RBC: 4.5 Mil/uL (ref 3.87–5.11)
RDW: 13.9 % (ref 11.5–15.5)
WBC: 5.7 10*3/uL (ref 4.0–10.5)

## 2022-09-28 LAB — COMPREHENSIVE METABOLIC PANEL
ALT: 14 U/L (ref 0–35)
AST: 20 U/L (ref 0–37)
Albumin: 4.4 g/dL (ref 3.5–5.2)
Alkaline Phosphatase: 39 U/L (ref 39–117)
BUN: 25 mg/dL — ABNORMAL HIGH (ref 6–23)
CO2: 30 mEq/L (ref 19–32)
Calcium: 9.5 mg/dL (ref 8.4–10.5)
Chloride: 101 mEq/L (ref 96–112)
Creatinine, Ser: 0.8 mg/dL (ref 0.40–1.20)
GFR: 80.96 mL/min (ref >60.00)
Glucose, Bld: 82 mg/dL (ref 70–99)
Potassium: 4 mEq/L (ref 3.5–5.1)
Sodium: 137 mEq/L (ref 135–145)
Total Bilirubin: 1.1 mg/dL (ref 0.2–1.2)
Total Protein: 6.6 g/dL (ref 6.0–8.3)

## 2022-09-28 LAB — HEMOGLOBIN A1C: Hgb A1c MFr Bld: 5.5 % (ref 4.6–6.5)

## 2022-09-28 LAB — TSH: TSH: 2.52 u[IU]/mL (ref 0.35–5.50)

## 2022-09-28 LAB — VITAMIN D 25 HYDROXY (VIT D DEFICIENCY, FRACTURES): VITD: 41.51 ng/mL (ref 30.00–100.00)

## 2022-09-28 NOTE — Progress Notes (Signed)
Patient ID: Sheena Brennan, female  DOB: May 06, 1966, 56 y.o.   MRN: [id number] Patient Care Team    Relationship Specialty Notifications Start End  Ma Hillock, DO PCP - General Family Medicine  03/15/16   Richmond Campbell, MD Consulting Physician Gastroenterology  03/19/16   Arvella Nigh, MD Consulting Physician Obstetrics and Gynecology  04/12/17     Chief Complaint  Patient presents with   Annual Exam    Pt is fasting     Subjective: Sheena Brennan is a 56 y.o.  Female  present for CPE  All past medical history, surgical history, allergies, family history, immunizations, medications and social history were updated in the electronic medical record today. All recent labs, ED visits and hospitalizations within the last year were reviewed.  Health maintenance:  Colonoscopy: every 5 years for polyps, Dr. Earlean Shawl, UTD 07/2021. Mammogram:UTD with GYN Cervical cancer screening: UTD with GYN [facility] Immunizations: tdap UTD 2017, yearly flu shot encouraged declined. Shingrix declined.  Infectious disease screening: HIV and Hep C completed DEXA:osteopenia (-2.0) 08/2019, taking vit D, not calcium.  fosamax>>scheduled GYN Assistive device: none Oxygen XTG:GYIR Patient has a Dental home. Hospitalizations/ED visits: reviewed  Patient reports she saw her gynecologist last week and had a urinalysis which showed hematuria.  She is post to return to her gynecologist at the beginning of the new year for repeat urinalysis.  She states she has noticed some mild left lower back discomfort and would like to be tested for an infection today.  Denies fevers, chills, nausea, vomit or dysuria.     09/28/2022    1:04 PM 10/05/2021    8:58 AM 08/15/2021   11:26 AM 04/05/2020    8:31 AM 07/06/2019    8:02 AM  Depression screen PHQ 2/9  Decreased Interest 0 0 0 0 0  Down, Depressed, Hopeless 0 0 0 0 0  PHQ - 2 Score 0 0 0 0 0       No data to display           Immunization History   Administered Date(s) Administered   Influenza-Unspecified 07/16/2015, 07/15/2018   Tdap 04/09/2016    Past Medical History:  Diagnosis Date   Allergy    Arthritis    Colon polyp 2017   adenoma, Dr. Earlean Shawl   COVID-19 02/2020   mild flu like symptoms.    Hemorrhoids    banded multiple times   Insomnia    Osteopenia 2015   By DEXA   Allergies  Allergen Reactions   Demeclocycline Nausea And Vomiting and Other (See Comments)    BRAND NAME: "Declomycin"- antibiotic   Mushroom Extract Complex Hives and Other (See Comments)    Mushrooms = HIVES   Tetracyclines & Related Nausea And Vomiting   Past Surgical History:  Procedure Laterality Date   BUNIONECTOMY Bilateral 2017   CESAREAN SECTION  1997, 2000   HEMORRHOID BANDING  03/2016   6th banding   SEPTOPLASTY  1981   Family History  Problem Relation Age of Onset   Osteoporosis Mother    Arthritis Mother    Breast cancer Maternal Grandmother    Heart disease Father    Social History   Social History Narrative   Married to [place]. 2 children (eric and brian)   Bachelors degree, Optometrist, employed.   Denies tobacco, drug or alcohol use.   Drinks caffeinated beverages.   Takes a daily vitamin.   Exercise routinely.   Wears her seatbelt,  smoke detector in the home   Firearms in a locked cabinet In the home   Feels safe in her relationships.    Allergies as of 09/28/2022       Reactions   Demeclocycline Nausea And Vomiting, Other (See Comments)   BRAND NAME: "Declomycin"- antibiotic   Mushroom Extract Complex Hives, Other (See Comments)   Mushrooms = HIVES   Tetracyclines & Related Nausea And Vomiting        Medication List        Accurate as of September 28, 2022  1:26 PM. If you have any questions, ask your nurse or doctor.          STOP taking these medications    amoxicillin-clavulanate 875-125 MG tablet Commonly known as: AUGMENTIN Stopped by: Howard Pouch, DO       TAKE these medications     acetaminophen 500 MG tablet Commonly known as: TYLENOL Take 500-1,000 mg by mouth every 6 (six) hours as needed for mild pain (or headaches).   alendronate 70 MG tablet Commonly known as: FOSAMAX Take 70 mg by mouth every Monday.   hydrocortisone 1 % ointment Apply 1 application topically 2 (two) times daily. Apply to area BID.   Vitamin D3 125 MCG (5000 UT) Caps Take 10,000 Units by mouth every 7 (seven) days.   zolpidem 10 MG tablet Commonly known as: AMBIEN Take 5 mg by mouth at bedtime as needed for sleep.        All past medical history, surgical history, allergies, family history, immunizations andmedications were updated in the EMR today and reviewed under the history and medication portions of their EMR.      ROS 14 pt review of systems performed and negative (unless mentioned in an HPI)  Objective: BP 120/69   Pulse 67   Temp 98.4 F (36.9 C) (Oral)   Ht 5' 1.25" (1.556 m)   Wt 116 lb (52.6 kg)   SpO2 100%   BMI 21.74 kg/m  Physical Exam Vitals and nursing note reviewed.  Constitutional:      General: She is not in acute distress.    Appearance: Normal appearance. She is normal weight. She is not ill-appearing or toxic-appearing.  HENT:     Head: Normocephalic and atraumatic.     Right Ear: Tympanic membrane, ear canal and external ear normal. There is no impacted cerumen.     Left Ear: Tympanic membrane, ear canal and external ear normal. There is no impacted cerumen.     Nose: No congestion or rhinorrhea.     Mouth/Throat:     Mouth: Mucous membranes are moist.     Pharynx: Oropharynx is clear. No oropharyngeal exudate or posterior oropharyngeal erythema.  Eyes:     General: No scleral icterus.       Right eye: No discharge.        Left eye: No discharge.     Extraocular Movements: Extraocular movements intact.     Conjunctiva/sclera: Conjunctivae normal.     Pupils: Pupils are equal, round, and reactive to light.  Cardiovascular:     Rate and  Rhythm: Normal rate and regular rhythm.     Pulses: Normal pulses.     Heart sounds: Normal heart sounds. No murmur heard.    No friction rub. No gallop.  Pulmonary:     Effort: Pulmonary effort is normal. No respiratory distress.     Breath sounds: Normal breath sounds. No stridor. No wheezing, rhonchi or rales.  Chest:  Chest wall: No tenderness.  Abdominal:     General: Abdomen is flat. Bowel sounds are normal. There is no distension.     Palpations: Abdomen is soft. There is no mass.     Tenderness: There is no abdominal tenderness. There is no right CVA tenderness, left CVA tenderness, guarding or rebound.     Hernia: No hernia is present.  Musculoskeletal:        General: No swelling, tenderness or deformity. Normal range of motion.     Cervical back: Normal range of motion and neck supple. No rigidity or tenderness.     Right lower leg: No edema.     Left lower leg: No edema.  Lymphadenopathy:     Cervical: No cervical adenopathy.  Skin:    General: Skin is warm and dry.     Coloration: Skin is not jaundiced or pale.     Findings: No bruising, erythema, lesion or rash.  Neurological:     General: No focal deficit present.     Mental Status: She is alert and oriented to person, place, and time. Mental status is at baseline.     Cranial Nerves: No cranial nerve deficit.     Sensory: No sensory deficit.     Motor: No weakness.     Coordination: Coordination normal.     Gait: Gait normal.     Deep Tendon Reflexes: Reflexes normal.  Psychiatric:        Mood and Affect: Mood normal.        Behavior: Behavior normal.        Thought Content: Thought content normal.        Judgment: Judgment normal.      No results found.  Assessment/plan: Sheena Brennan is a 56 y.o. female present for CPE  Encounter for long-term (current) use of medications - Comprehensive metabolic panel Screening for deficiency anemia - CBC Lipid screening - Lipid panel  Diabetes mellitus  screening - Comprehensive metabolic panel - Hemoglobin A1c Osteopenia, unspecified location - vit d - TSH Hematuria: Rest.  Hydrate. Urinalysis with reflex culture sent today.  If it is infectious appearing will provide antibiotics at that time.  Patient is in agreement with plan.  Immunization declined Routine general medical examination at a health care facility Colonoscopy: every 5 years for polyps, Dr. Earlean Shawl, UTD 07/2021. Mammogram:UTD with GYN Cervical cancer screening: UTD with GYN [facility] Immunizations: tdap UTD 2017, yearly flu shot encouraged declined. Shingrix declined.  Infectious disease screening: HIV and Hep C completed DEXA:osteopenia (-2.0) 2020, taking vit D, not calcium.  fosamax>>scheduled GYN Patient was encouraged to exercise greater than 150 minutes a week. Patient was encouraged to choose a diet filled with fresh fruits and vegetables, and lean meats. AVS provided to patient today for education/recommendation on gender specific health and safety maintenance.  Return in about 1 year (around 09/30/2023) for cpe (20 min).  Orders Placed This Encounter  Procedures   CBC   Comprehensive metabolic panel   Hemoglobin A1c   Lipid panel   TSH   Vitamin D (25 hydroxy)   Urinalysis w microscopic + reflex cultur   No orders of the defined types were placed in this encounter.  Referral Orders  No referral(s) requested today     Electronically signed by: Howard Pouch, [place]

## 2022-09-28 NOTE — Patient Instructions (Addendum)

## 2022-09-28 NOTE — Addendum Note (Signed)
Addended by: Howard Pouch A on: 09/28/2022 03:57 PM   Modules accepted: Level of Service

## 2022-09-30 LAB — URINALYSIS W MICROSCOPIC + REFLEX CULTURE
Bilirubin Urine: NEGATIVE
Glucose, UA: NEGATIVE
Ketones, ur: NEGATIVE
Nitrites, Initial: NEGATIVE
Protein, ur: NEGATIVE
Specific Gravity, Urine: 1.008 (ref 1.001–1.035)
pH: <=5 (ref 5.0–8.0)

## 2022-09-30 LAB — URINE CULTURE
MICRO NUMBER:: 14323472
SPECIMEN QUALITY:: ADEQUATE

## 2022-09-30 LAB — CULTURE INDICATED

## 2022-10-05 ENCOUNTER — Encounter: Payer: BC Managed Care – PPO | Admitting: Family Medicine

## 2022-10-09 LAB — HM MAMMOGRAPHY

## 2022-11-27 ENCOUNTER — Other Ambulatory Visit: Payer: Self-pay

## 2022-11-27 DIAGNOSIS — M810 Age-related osteoporosis without current pathological fracture: Secondary | ICD-10-CM | POA: Insufficient documentation

## 2022-11-28 ENCOUNTER — Telehealth: Payer: Self-pay | Admitting: Pharmacy Technician

## 2022-11-28 NOTE — Telephone Encounter (Signed)
Auth Submission: APPROVED Payer: [employer] Medication & CPT/J Code(s) submitted: Evenity (Romosozumab) [id number] Route of submission (phone, fax, portal): PORTAL Phone # Fax # Auth type: Buy/Bill Units/visits requested: 12 Reference number: [id number] Approval from: 11/28/22 to 11/29/23

## 2022-11-30 ENCOUNTER — Other Ambulatory Visit: Payer: Self-pay

## 2022-12-11 ENCOUNTER — Ambulatory Visit (INDEPENDENT_AMBULATORY_CARE_PROVIDER_SITE_OTHER): Payer: 59

## 2022-12-11 ENCOUNTER — Encounter: Payer: Self-pay | Admitting: Obstetrics and Gynecology

## 2022-12-11 VITALS — BP 149/85 | HR 65 | Temp 97.7°F | Resp 18 | Ht 61.0 in | Wt 117.8 lb

## 2022-12-11 DIAGNOSIS — M810 Age-related osteoporosis without current pathological fracture: Secondary | ICD-10-CM | POA: Diagnosis not present

## 2022-12-11 MED ORDER — ROMOSOZUMAB-AQQG 105 MG/1.17ML ~~LOC~~ SOSY
210.0000 mg | PREFILLED_SYRINGE | Freq: Once | SUBCUTANEOUS | Status: AC
  Administered 2022-12-11: 210 mg via SUBCUTANEOUS
  Filled 2022-12-11: qty 2.34

## 2022-12-11 NOTE — Progress Notes (Signed)
Diagnosis: Osteoporosis  Provider:  Marshell Garfinkel MD  Procedure: Injection  Evenity (Romosozumab-aqqg), Dose: 210 mg, Site: subcutaneous, Number of injections: 2  Post Care: Observation period completed  Discharge: Condition: Good, Destination: Home . AVS Declined  Performed by:  Cleophus Molt, RN

## 2022-12-11 NOTE — Patient Instructions (Signed)
Romosozumab Injection What is this medication? ROMOSOZUMAB (roe moe SOZ ue mab) prevents and treats osteoporosis. It works by Paramedic stronger and less likely to break (fracture). It is a monoclonal antibody. This medicine may be used for other purposes; ask your health care provider or pharmacist if you have questions. COMMON BRAND NAME(S): EVENITY What should I tell my care team before I take this medication? They need to know if you have any of these conditions: Dental disease Heart attack Heart disease Kidney problems Low levels of calcium in the blood On dialysis Stroke Wear dentures An unusual or allergic reaction to romosozumab, other medications, foods, dyes or preservatives Pregnant or trying to get pregnant Breast-feeding How should I use this medication? This medication is injected under the skin. It is given by your care team in a hospital or clinic setting. A special MedGuide will be given to you by the pharmacist with each prescription and refill. Be sure to read this information carefully each time. Talk to your care team about the use of this medication in children. Special care may be needed. Overdosage: If you think you have taken too much of this medicine contact a poison control center or emergency room at once. NOTE: This medicine is only for you. Do not share this medicine with others. What if I miss a dose? Keep appointments for follow-up doses. It is important not to miss your dose. Call your care team if you are unable to keep an appointment. What may interact with this medication? Interactions are not expected. This list may not describe all possible interactions. Give your health care provider a list of all the medicines, herbs, non-prescription drugs, or dietary supplements you use. Also tell them if you smoke, drink alcohol, or use illegal drugs. Some items may interact with your medicine. What should I watch for while using this medication? Your  condition will be monitored carefully while you are receiving this medication. You may need bloodwork while taking this medication. You should make sure you get enough calcium and vitamin D while you are taking this medication. Discuss the foods you eat and the vitamins you take with your care team. Some people who take this medication have severe bone, joint, or muscle pain. This medication may also increase your risk for jaw problems or a broken thigh bone. Tell your care team right away if you have severe pain in your jaw, bones, joints, or muscles. Tell you care team if you have any pain that does not go away or that gets worse. Tell your dentist and dental surgeon that you are taking this medication. You should not have major dental surgery while on this medication. See your dentist to have a dental exam and fix any dental problems before starting this medication. Take good care of your teeth while on this medication. Make sure you see your dentist for regular follow-up appointments. What side effects may I notice from receiving this medication? Side effects that you should report to your care team as soon as possible: Allergic reactions or angioedema--skin rash, itching or hives, swelling of the face, eyes, lips, tongue, arms, or legs, trouble swallowing or breathing Heart attack--pain or tightness in the chest, shoulders, arms, or jaw, nausea, shortness of breath, cold or clammy skin, feeling faint or lightheaded Low calcium level--muscle pain or cramps, confusion, tingling, or numbness in the hands or feet Osteonecrosis of the jaw--pain, swelling, or redness in the mouth, numbness of the jaw, poor healing after dental work,  unusual discharge from the mouth, visible bones in the mouth Severe bone, joint, or muscle pain Stroke--sudden numbness or weakness of the face, arm, or leg, trouble speaking, confusion, trouble walking, loss of balance or coordination, dizziness, severe headache, change in  vision Side effects that usually do not require medical attention (report to your care team if they continue or are bothersome): Headache Joint pain Muscle spasms Pain, redness, or irritation at injection site Swelling of the ankles, hands, or feet This list may not describe all possible side effects. Call your doctor for medical advice about side effects. You may report side effects to FDA at 1-800-FDA-1088. Where should I keep my medication? This medication is given in a hospital or clinic. It will not be stored at home. NOTE: This sheet is a summary. It may not cover all possible information. If you have questions about this medicine, talk to your doctor, pharmacist, or health care provider.  2023 Elsevier/Gold Standard (2021-11-01 00:00:00)

## 2023-01-08 ENCOUNTER — Ambulatory Visit (INDEPENDENT_AMBULATORY_CARE_PROVIDER_SITE_OTHER): Payer: 59

## 2023-01-08 VITALS — BP 134/84 | HR 79 | Temp 98.4°F | Resp 16 | Ht 61.0 in | Wt 118.4 lb

## 2023-01-08 DIAGNOSIS — M810 Age-related osteoporosis without current pathological fracture: Secondary | ICD-10-CM | POA: Diagnosis not present

## 2023-01-08 MED ORDER — ROMOSOZUMAB-AQQG 105 MG/1.17ML ~~LOC~~ SOSY
210.0000 mg | PREFILLED_SYRINGE | Freq: Once | SUBCUTANEOUS | Status: AC
  Administered 2023-01-08: 210 mg via SUBCUTANEOUS

## 2023-01-08 NOTE — Progress Notes (Signed)
Diagnosis: Osteoporosis  Provider:  Marshell Garfinkel MD  Procedure: Injection  Evenity (Romosozumab-aqqg), Dose: 210 mg, Site: subcutaneous, Number of injections: 2  Post Care:  n/a  Discharge: Condition: Good, Destination: Home . AVS Declined  Performed by:  Adelina Mings, LPN

## 2023-02-05 ENCOUNTER — Ambulatory Visit (INDEPENDENT_AMBULATORY_CARE_PROVIDER_SITE_OTHER): Payer: 59

## 2023-02-05 VITALS — BP 134/78 | HR 65 | Temp 97.9°F | Resp 16 | Ht 61.0 in | Wt 117.0 lb

## 2023-02-05 DIAGNOSIS — M810 Age-related osteoporosis without current pathological fracture: Secondary | ICD-10-CM

## 2023-02-05 MED ORDER — ROMOSOZUMAB-AQQG 105 MG/1.17ML ~~LOC~~ SOSY
210.0000 mg | PREFILLED_SYRINGE | Freq: Once | SUBCUTANEOUS | Status: AC
  Administered 2023-02-05: 210 mg via SUBCUTANEOUS
  Filled 2023-02-05: qty 2.34

## 2023-02-05 NOTE — Progress Notes (Signed)
Diagnosis: Osteoporosis  Provider:  Chilton Greathouse MD  Procedure: Injection  Evenity (Romosozumab-aqqg), Dose: 210 mg, Site: subcutaneous, Number of injections: 2   Discharge: Condition: Good, Destination: Home . AVS Declined  Performed by:  Nat Math, RN

## 2023-02-28 ENCOUNTER — Other Ambulatory Visit: Payer: Self-pay | Admitting: Obstetrics and Gynecology

## 2023-02-28 ENCOUNTER — Ambulatory Visit
Admission: RE | Admit: 2023-02-28 | Discharge: 2023-02-28 | Disposition: A | Discharge status: Home / Self Care | Payer: 59 | Source: Ambulatory Visit | Attending: Obstetrics and Gynecology | Admitting: Obstetrics and Gynecology

## 2023-02-28 DIAGNOSIS — G8929 Other chronic pain: Secondary | ICD-10-CM

## 2023-02-28 MED ORDER — IOPAMIDOL (ISOVUE-300) INJECTION 61%
100.0000 mL | Freq: Once | INTRAVENOUS | Status: AC | PRN
  Administered 2023-02-28: 100 mL via INTRAVENOUS

## 2023-03-05 ENCOUNTER — Ambulatory Visit (INDEPENDENT_AMBULATORY_CARE_PROVIDER_SITE_OTHER): Payer: 59

## 2023-03-05 VITALS — BP 125/80 | HR 71 | Temp 98.5°F | Resp 18 | Ht 61.0 in | Wt 118.4 lb

## 2023-03-05 DIAGNOSIS — M810 Age-related osteoporosis without current pathological fracture: Secondary | ICD-10-CM

## 2023-03-05 MED ORDER — ROMOSOZUMAB-AQQG 105 MG/1.17ML ~~LOC~~ SOSY
210.0000 mg | PREFILLED_SYRINGE | Freq: Once | SUBCUTANEOUS | Status: AC
  Administered 2023-03-05: 210 mg via SUBCUTANEOUS
  Filled 2023-03-05: qty 2.34

## 2023-03-05 NOTE — Progress Notes (Signed)
Diagnosis: Osteoporosis  Provider:  Chilton Greathouse MD  Procedure: Injection  Evenity (Romosozumab-aqqg), Dose: 210 mg, Site: subcutaneous, Number of injections: 2  Post Care: Patient declined observation  Discharge: Condition: Good, Destination: Home . AVS Declined  Performed by:  Adriana Mccallum, RN

## 2023-04-02 ENCOUNTER — Ambulatory Visit (INDEPENDENT_AMBULATORY_CARE_PROVIDER_SITE_OTHER): Payer: 59

## 2023-04-02 VITALS — BP 122/80 | HR 64 | Temp 98.0°F | Resp 18 | Ht 61.0 in | Wt 119.6 lb

## 2023-04-02 DIAGNOSIS — M810 Age-related osteoporosis without current pathological fracture: Secondary | ICD-10-CM

## 2023-04-02 MED ORDER — ROMOSOZUMAB-AQQG 105 MG/1.17ML ~~LOC~~ SOSY
210.0000 mg | PREFILLED_SYRINGE | Freq: Once | SUBCUTANEOUS | Status: AC
  Administered 2023-04-02: 210 mg via SUBCUTANEOUS

## 2023-04-02 NOTE — Progress Notes (Signed)
Diagnosis: Osteoporosis  Provider:  Mannam, Praveen MD  Procedure: Injection  Evenity (Romosozumab-aqqg), Dose: 210 mg, Site: subcutaneous, Number of injections: 2  Post Care:     Discharge: Condition: Good, Destination: Home . AVS Declined  Performed by:  Camaron Cammack, RN       

## 2023-04-30 ENCOUNTER — Ambulatory Visit: Payer: 59 | Admitting: *Deleted

## 2023-04-30 VITALS — BP 120/81 | HR 67 | Temp 97.8°F | Resp 18 | Ht 61.0 in | Wt 118.2 lb

## 2023-04-30 DIAGNOSIS — M810 Age-related osteoporosis without current pathological fracture: Secondary | ICD-10-CM | POA: Diagnosis not present

## 2023-04-30 MED ORDER — ROMOSOZUMAB-AQQG 105 MG/1.17ML ~~LOC~~ SOSY
210.0000 mg | PREFILLED_SYRINGE | Freq: Once | SUBCUTANEOUS | Status: AC
  Administered 2023-04-30: 210 mg via SUBCUTANEOUS
  Filled 2023-04-30: qty 2.34

## 2023-04-30 NOTE — Progress Notes (Signed)
Diagnosis: Osteoporosis  Provider:  Mannam, Praveen MD  Procedure: Injection  Evenity (Romosozumab-aqqg), Dose: 210 mg, Site: subcutaneous, Number of injections: 2  Post Care: Observation period completed  Discharge: Condition: Good, Destination: Home . AVS Declined  Performed by:  Williams, Kathryn A, RN       

## 2023-05-28 ENCOUNTER — Ambulatory Visit (INDEPENDENT_AMBULATORY_CARE_PROVIDER_SITE_OTHER): Payer: 59 | Admitting: *Deleted

## 2023-05-28 VITALS — BP 121/78 | HR 62 | Temp 97.9°F | Resp 18 | Ht 61.0 in | Wt 116.8 lb

## 2023-05-28 DIAGNOSIS — M810 Age-related osteoporosis without current pathological fracture: Secondary | ICD-10-CM

## 2023-05-28 MED ORDER — ROMOSOZUMAB-AQQG 105 MG/1.17ML ~~LOC~~ SOSY
210.0000 mg | PREFILLED_SYRINGE | Freq: Once | SUBCUTANEOUS | Status: AC
  Administered 2023-05-28: 210 mg via SUBCUTANEOUS

## 2023-05-28 NOTE — Progress Notes (Signed)
Diagnosis: Osteoporosis  Provider:  Mannam, Praveen MD  Procedure: Injection  Evenity (Romosozumab-aqqg), Dose: 210 mg, Site: subcutaneous, Number of injections: 2  Post Care: Observation period completed  Discharge: Condition: Good, Destination: Home . AVS Declined  Performed by:  Williams, Kathryn A, RN       

## 2023-06-25 ENCOUNTER — Ambulatory Visit (INDEPENDENT_AMBULATORY_CARE_PROVIDER_SITE_OTHER): Payer: 59

## 2023-06-25 VITALS — BP 132/82 | HR 69 | Temp 98.2°F | Resp 16 | Ht 61.0 in | Wt 116.0 lb

## 2023-06-25 DIAGNOSIS — M810 Age-related osteoporosis without current pathological fracture: Secondary | ICD-10-CM

## 2023-06-25 MED ORDER — ROMOSOZUMAB-AQQG 105 MG/1.17ML ~~LOC~~ SOSY
210.0000 mg | PREFILLED_SYRINGE | Freq: Once | SUBCUTANEOUS | Status: AC
  Administered 2023-06-25: 210 mg via SUBCUTANEOUS

## 2023-06-25 NOTE — Progress Notes (Signed)
Diagnosis: Osteoporosis  Provider:  Mannam, Praveen MD  Procedure: Injection  Evenity (Romosozumab-aqqg), Dose: 210 mg, Site: subcutaneous, Number of injections: 2   Discharge: Condition: Good, Destination: Home . AVS Declined  Performed by:  Cathy Alston, RN       

## 2023-08-05 ENCOUNTER — Ambulatory Visit: Payer: 59 | Admitting: *Deleted

## 2023-08-05 VITALS — BP 129/83 | HR 76 | Temp 97.9°F | Resp 16 | Ht 61.0 in | Wt 114.2 lb

## 2023-08-05 DIAGNOSIS — M810 Age-related osteoporosis without current pathological fracture: Secondary | ICD-10-CM

## 2023-08-05 MED ORDER — ROMOSOZUMAB-AQQG 105 MG/1.17ML ~~LOC~~ SOSY
210.0000 mg | PREFILLED_SYRINGE | Freq: Once | SUBCUTANEOUS | Status: AC
  Administered 2023-08-05: 210 mg via SUBCUTANEOUS
  Filled 2023-08-05: qty 2.34

## 2023-08-05 NOTE — Progress Notes (Signed)
Diagnosis: Osteoporosis  Provider:  Mannam, Praveen MD  Procedure: Injection  Evenity (Romosozumab-aqqg), Dose: 210 mg, Site: subcutaneous, Number of injections: 2  Post Care: Observation period completed  Discharge: Condition: Good, Destination: Home . AVS Declined  Performed by:  Williams, Kathryn A, RN       

## 2023-09-02 ENCOUNTER — Ambulatory Visit: Payer: 59

## 2023-09-02 VITALS — BP 150/91 | HR 65 | Temp 98.1°F | Resp 16 | Ht 61.0 in | Wt 115.8 lb

## 2023-09-02 DIAGNOSIS — M810 Age-related osteoporosis without current pathological fracture: Secondary | ICD-10-CM | POA: Diagnosis not present

## 2023-09-02 MED ORDER — ROMOSOZUMAB-AQQG 105 MG/1.17ML ~~LOC~~ SOSY
210.0000 mg | PREFILLED_SYRINGE | Freq: Once | SUBCUTANEOUS | Status: AC
  Administered 2023-09-02: 210 mg via SUBCUTANEOUS
  Filled 2023-09-02: qty 2.34

## 2023-09-02 NOTE — Progress Notes (Signed)
Diagnosis: Osteoporosis  Provider:  Chilton Greathouse MD  Procedure: Injection  Evenity (Romosozumab-aqqg), Dose: 210 mg, Site: subcutaneous, Number of injections: 2  Post Care:  left and right lower abdomen  Discharge: Condition: Good, Destination: Home . AVS Declined  Performed by:  Rico Ala, LPN

## 2023-09-30 ENCOUNTER — Ambulatory Visit: Payer: 59

## 2023-10-01 ENCOUNTER — Ambulatory Visit: Payer: 59

## 2023-10-01 VITALS — BP 130/83 | HR 70 | Temp 98.1°F | Resp 18 | Ht 61.0 in | Wt 114.8 lb

## 2023-10-01 DIAGNOSIS — M810 Age-related osteoporosis without current pathological fracture: Secondary | ICD-10-CM | POA: Diagnosis not present

## 2023-10-01 MED ORDER — ROMOSOZUMAB-AQQG 105 MG/1.17ML ~~LOC~~ SOSY
210.0000 mg | PREFILLED_SYRINGE | Freq: Once | SUBCUTANEOUS | Status: AC
  Administered 2023-10-01: 210 mg via SUBCUTANEOUS
  Filled 2023-10-01: qty 2.34

## 2023-10-01 NOTE — Patient Instructions (Signed)
Return in about 1 year (around 10/02/2024) for cpe (20 min).        Great to see you today.  I have refilled the medication(s) we provide.   If labs were collected or images ordered, we will inform you of  results once we have received them and reviewed. We will contact you either by echart message, or telephone call.  Please give ample time to the testing facility, and our office to run,  receive and review results. Please do not call inquiring of results, even if you can see them in your chart. We will contact you as soon as we are able. If it has been over 1 week since the test was completed, and you have not yet heard from [place], then please call us.    - echart message- for normal results that have been seen by the patient already.   - telephone call: abnormal results or if patient has not viewed results in their echart.  If a referral to a specialist was entered for you, please call us in 2 weeks if you have not heard from the specialist office to schedule.

## 2023-10-01 NOTE — Progress Notes (Signed)
Diagnosis: Osteoporosis  Provider:  Chilton Greathouse MD  Procedure: Injection  Evenity (Romosozumab-aqqg), Dose: 210 mg, Site: subcutaneous, Number of injections: 2  Injection Site(s): Left lower quad. abdomen and Right lower quad. abdomnen  Discharge: Condition: Good, Destination: Home . AVS Declined  Performed by:  Nat Math, RN

## 2023-10-01 NOTE — Progress Notes (Unsigned)
Patient ID: Sheena Brennan, female  DOB: [birth year], 57 y.o.   MRN: [id number] Patient Care Team    Relationship Specialty Notifications Start End  Natalia Leatherwood, DO PCP - General Family Medicine  03/15/16   Sharrell Ku, MD Consulting Physician Gastroenterology  03/19/16   Richardean Chimera, MD Consulting Physician Obstetrics and Gynecology  04/12/17     No chief complaint on file.   Subjective: Sheena Brennan is a 57 y.o.  Female  present for CPE  All past medical history, surgical history, allergies, family history, immunizations, medications and social history were updated in the electronic medical record today. All recent labs, ED visits and hospitalizations within the last year were reviewed.  Health maintenance:  Colonoscopy: every 5 years for polyps, Dr. Kinnie Scales, UTD 07/2021. Mammogram:UTD with GYN-requested Cervical cancer screening: UTD with GYN [facility] Immunizations: tdap UTD 2017, yearly flu shot encouraged declined. Shingrix declined Infectious disease screening: HIV and Hep C completed DEXA:osteopenia (-2.0) 08/2019, taking vit D, not calcium.  fosamax>>scheduled GYN Assistive device: none Oxygen YQI:HKVQ Patient has a Dental home. Hospitalizations/ED visits: reviewed      09/02/2023    8:13 AM 09/28/2022    1:04 PM 10/05/2021    8:58 AM 08/15/2021   11:26 AM 04/05/2020    8:31 AM  Depression screen PHQ 2/9  Decreased Interest 0 0 0 0 0  Down, Depressed, Hopeless 0 0 0 0 0  PHQ - 2 Score 0 0 0 0 0       No data to display           Immunization History  Administered Date(s) Administered   Influenza-Unspecified 07/16/2015, 07/15/2018   Tdap 04/09/2016    Past Medical History:  Diagnosis Date   Allergy    Arthritis    Colon polyp 2017   adenoma, Dr. Kinnie Scales   COVID-19 02/2020   mild flu like symptoms.    Hemorrhoids    banded multiple times   Insomnia    Osteopenia 2015   By DEXA   Allergies  Allergen Reactions   Demeclocycline Nausea  And Vomiting and Other (See Comments)    BRAND NAME: "Declomycin"- antibiotic   Mushroom Extract Complex (Do Not Select) Hives and Other (See Comments)    Mushrooms = HIVES   Tetracyclines & Related Nausea And Vomiting   Past Surgical History:  Procedure Laterality Date   BUNIONECTOMY Bilateral 2017   CESAREAN SECTION  1997, 2000   HEMORRHOID BANDING  03/2016   6th banding   SEPTOPLASTY  1981   Family History  Problem Relation Age of Onset   Osteoporosis Mother    Arthritis Mother    Breast cancer Maternal Grandmother    Heart disease Father    Social History   Social History Narrative   Married to [place]. 2 children (eric and brian)   Bachelors degree, Airline pilot, employed.   Denies tobacco, drug or alcohol use.   Drinks caffeinated beverages.   Takes a daily vitamin.   Exercise routinely.   Wears her seatbelt, smoke detector in the home   Firearms in a locked cabinet In the home   Feels safe in her relationships.    Allergies as of 10/02/2023       Reactions   Demeclocycline Nausea And Vomiting, Other (See Comments)   BRAND NAME: "Declomycin"- antibiotic   Mushroom Extract Complex (do Not Select) Hives, Other (See Comments)   Mushrooms = HIVES   Tetracyclines & Related Nausea And  Vomiting        Medication List        Accurate as of October 01, 2023 12:53 PM. If you have any questions, ask your nurse or doctor.          acetaminophen 500 MG tablet Commonly known as: TYLENOL Take 500-1,000 mg by mouth every 6 (six) hours as needed for mild pain (or headaches).   alendronate 70 MG tablet Commonly known as: FOSAMAX Take 70 mg by mouth every Monday.   hydrocortisone 1 % ointment Apply 1 application topically 2 (two) times daily. Apply to area BID.   Vitamin D3 125 MCG (5000 UT) Caps Take 10,000 Units by mouth every 7 (seven) days.   zolpidem 10 MG tablet Commonly known as: AMBIEN Take 5 mg by mouth at bedtime as needed for sleep.        All  past medical history, surgical history, allergies, family history, immunizations andmedications were updated in the EMR today and reviewed under the history and medication portions of their EMR.      ROS 14 pt review of systems performed and negative (unless mentioned in an HPI)  Objective: There were no vitals taken for this visit. Physical Exam Vitals and nursing note reviewed.  Constitutional:      General: She is not in acute distress.    Appearance: Normal appearance. She is normal weight. She is not ill-appearing or toxic-appearing.  HENT:     Head: Normocephalic and atraumatic.     Right Ear: Tympanic membrane, ear canal and external ear normal. There is no impacted cerumen.     Left Ear: Tympanic membrane, ear canal and external ear normal. There is no impacted cerumen.     Nose: No congestion or rhinorrhea.     Mouth/Throat:     Mouth: Mucous membranes are moist.     Pharynx: Oropharynx is clear. No oropharyngeal exudate or posterior oropharyngeal erythema.  Eyes:     General: No scleral icterus.       Right eye: No discharge.        Left eye: No discharge.     Extraocular Movements: Extraocular movements intact.     Conjunctiva/sclera: Conjunctivae normal.     Pupils: Pupils are equal, round, and reactive to light.  Cardiovascular:     Rate and Rhythm: Normal rate and regular rhythm.     Pulses: Normal pulses.     Heart sounds: Normal heart sounds. No murmur heard.    No friction rub. No gallop.  Pulmonary:     Effort: Pulmonary effort is normal. No respiratory distress.     Breath sounds: Normal breath sounds. No stridor. No wheezing, rhonchi or rales.  Chest:     Chest wall: No tenderness.  Abdominal:     General: Abdomen is flat. Bowel sounds are normal. There is no distension.     Palpations: Abdomen is soft. There is no mass.     Tenderness: There is no abdominal tenderness. There is no right CVA tenderness, left CVA tenderness, guarding or rebound.      Hernia: No hernia is present.  Musculoskeletal:        General: No swelling, tenderness or deformity. Normal range of motion.     Cervical back: Normal range of motion and neck supple. No rigidity or tenderness.     Right lower leg: No edema.     Left lower leg: No edema.  Lymphadenopathy:     Cervical: No cervical adenopathy.  Skin:  General: Skin is warm and dry.     Coloration: Skin is not jaundiced or pale.     Findings: No bruising, erythema, lesion or rash.  Neurological:     General: No focal deficit present.     Mental Status: She is alert and oriented to person, place, and time. Mental status is at baseline.     Cranial Nerves: No cranial nerve deficit.     Sensory: No sensory deficit.     Motor: No weakness.     Coordination: Coordination normal.     Gait: Gait normal.     Deep Tendon Reflexes: Reflexes normal.  Psychiatric:        Mood and Affect: Mood normal.        Behavior: Behavior normal.        Thought Content: Thought content normal.        Judgment: Judgment normal.      No results found.  Assessment/plan: Sheena Brennan is a 57 y.o. female present for CPE  Encounter for long-term (current) use of medications - Comprehensive metabolic panel Screening for deficiency anemia - CBC Lipid screening - Lipid panel  Diabetes mellitus screening - Comprehensive metabolic panel - Hemoglobin A1c Osteopenia, unspecified location - vit d - TSH Hematuria: Rest.  Hydrate. Urinalysis with reflex culture sent today.  If it is infectious appearing will provide antibiotics at that time.  Patient is in agreement with plan.  Immunization declined Routine general medical examination at a health care facility Colonoscopy: every 5 years for polyps, Dr. Kinnie Scales, UTD 07/2021. Mammogram:UTD with GYN Cervical cancer screening: UTD with GYN [facility] Immunizations: tdap UTD 2017, yearly flu shot encouraged declined. Shingrix declined.  Infectious disease screening: HIV and  Hep C completed DEXA:osteopenia (-2.0) 2020, taking vit D, not calcium.  fosamax>>scheduled GYN Patient was encouraged to exercise greater than 150 minutes a week. Patient was encouraged to choose a diet filled with fresh fruits and vegetables, and lean meats. AVS provided to patient today for education/recommendation on gender specific health and safety maintenance.  No follow-ups on file.  No orders of the defined types were placed in this encounter.  No orders of the defined types were placed in this encounter.  Referral Orders  No referral(s) requested today     Electronically signed by: Felix Pacini, DO [facility]

## 2023-10-02 ENCOUNTER — Ambulatory Visit (INDEPENDENT_AMBULATORY_CARE_PROVIDER_SITE_OTHER): Payer: 59 | Admitting: Family Medicine

## 2023-10-02 ENCOUNTER — Encounter: Payer: Self-pay | Admitting: Family Medicine

## 2023-10-02 VITALS — BP 112/76 | HR 65 | Temp 97.5°F | Ht 61.42 in | Wt 116.0 lb

## 2023-10-02 DIAGNOSIS — Z0000 Encounter for general adult medical examination without abnormal findings: Secondary | ICD-10-CM | POA: Diagnosis not present

## 2023-10-02 DIAGNOSIS — Z13220 Encounter for screening for lipoid disorders: Secondary | ICD-10-CM

## 2023-10-02 DIAGNOSIS — D649 Anemia, unspecified: Secondary | ICD-10-CM | POA: Diagnosis not present

## 2023-10-02 DIAGNOSIS — M810 Age-related osteoporosis without current pathological fracture: Secondary | ICD-10-CM | POA: Diagnosis not present

## 2023-10-02 DIAGNOSIS — Z131 Encounter for screening for diabetes mellitus: Secondary | ICD-10-CM

## 2023-10-02 DIAGNOSIS — M8589 Other specified disorders of bone density and structure, multiple sites: Secondary | ICD-10-CM

## 2023-10-02 DIAGNOSIS — Z23 Encounter for immunization: Secondary | ICD-10-CM

## 2023-10-02 LAB — LIPID PANEL
Cholesterol: 227 mg/dL — ABNORMAL HIGH (ref 0–200)
HDL: 60.9 mg/dL (ref >39.00)
LDL Cholesterol: 145 mg/dL — ABNORMAL HIGH (ref 0–99)
NonHDL: 166.14
Total CHOL/HDL Ratio: 4
Triglycerides: 107 mg/dL (ref 0.0–149.0)
VLDL: 21.4 mg/dL (ref 0.0–40.0)

## 2023-10-02 LAB — IBC + FERRITIN
Ferritin: 148.1 ng/mL (ref 10.0–291.0)
Iron: 92 ug/dL (ref 42–145)
Saturation Ratios: 27.8 % (ref 20.0–50.0)
TIBC: 330.4 ug/dL (ref 250.0–450.0)
Transferrin: 236 mg/dL (ref 212.0–360.0)

## 2023-10-02 LAB — COMPREHENSIVE METABOLIC PANEL
ALT: 15 U/L (ref 0–35)
AST: 21 U/L (ref 0–37)
Albumin: 4.3 g/dL (ref 3.5–5.2)
Alkaline Phosphatase: 51 U/L (ref 39–117)
BUN: 25 mg/dL — ABNORMAL HIGH (ref 6–23)
CO2: 31 meq/L (ref 19–32)
Calcium: 9.1 mg/dL (ref 8.4–10.5)
Chloride: 102 meq/L (ref 96–112)
Creatinine, Ser: 0.72 mg/dL (ref 0.40–1.20)
GFR: 91.23 mL/min (ref >60.00)
Glucose, Bld: 87 mg/dL (ref 70–99)
Potassium: 3.9 meq/L (ref 3.5–5.1)
Sodium: 138 meq/L (ref 135–145)
Total Bilirubin: 1 mg/dL (ref 0.2–1.2)
Total Protein: 6.6 g/dL (ref 6.0–8.3)

## 2023-10-02 LAB — VITAMIN D 25 HYDROXY (VIT D DEFICIENCY, FRACTURES): VITD: 40.18 ng/mL (ref 30.00–100.00)

## 2023-10-02 LAB — CBC
HCT: 42.2 % (ref 36.0–46.0)
Hemoglobin: 14.3 g/dL (ref 12.0–15.0)
MCHC: 33.8 g/dL (ref 30.0–36.0)
MCV: 94.6 fL (ref 78.0–100.0)
Platelets: 178 10*3/uL (ref 150.0–400.0)
RBC: 4.46 Mil/uL (ref 3.87–5.11)
RDW: 14 % (ref 11.5–15.5)
WBC: 4 10*3/uL (ref 4.0–10.5)

## 2023-10-02 LAB — TSH: TSH: 3.29 u[IU]/mL (ref 0.35–5.50)

## 2023-10-02 LAB — HEMOGLOBIN A1C: Hgb A1c MFr Bld: 5.7 % (ref 4.6–6.5)

## 2023-10-11 LAB — HM MAMMOGRAPHY

## 2023-10-22 ENCOUNTER — Encounter: Payer: Self-pay | Admitting: Obstetrics and Gynecology

## 2023-10-28 ENCOUNTER — Ambulatory Visit: Payer: 59

## 2023-10-29 ENCOUNTER — Ambulatory Visit: Payer: 59

## 2023-11-04 ENCOUNTER — Encounter: Payer: Self-pay | Admitting: Obstetrics and Gynecology

## 2023-11-05 ENCOUNTER — Ambulatory Visit: Payer: Managed Care, Other (non HMO)

## 2023-11-05 VITALS — BP 143/82 | HR 71 | Temp 98.2°F | Resp 18 | Ht 61.0 in | Wt 116.0 lb

## 2023-11-05 DIAGNOSIS — M810 Age-related osteoporosis without current pathological fracture: Secondary | ICD-10-CM | POA: Diagnosis not present

## 2023-11-05 MED ORDER — ROMOSOZUMAB-AQQG 105 MG/1.17ML ~~LOC~~ SOSY
210.0000 mg | PREFILLED_SYRINGE | Freq: Once | SUBCUTANEOUS | Status: AC
Start: 2023-11-05 — End: 2023-11-05
  Administered 2023-11-05: 210 mg via SUBCUTANEOUS
  Filled 2023-11-05: qty 2.34

## 2023-11-05 NOTE — Progress Notes (Signed)
Diagnosis: Osteoporosis  Provider:  Chilton Greathouse MD  Procedure: Injection  Evenity (Romosozumab-aqqg), Dose: 210 mg, Site: subcutaneous, Number of injections: 2  Injection Site(s): Left lower quad. abdomen and Right lower quad. abdomne  Post Care: Patient declined observation  Discharge: Condition: Good, Destination: Home . AVS Declined  Performed by:  Wyvonne Lenz, RN

## 2023-11-18 ENCOUNTER — Telehealth: Payer: Self-pay

## 2023-11-18 ENCOUNTER — Other Ambulatory Visit: Payer: Self-pay

## 2023-11-18 NOTE — Telephone Encounter (Signed)
Auth Submission: APPROVED Site of care: Site of care: [facility] Payer: [employer] Medication & CPT/J Code(s) submitted: Evenity (Romosozumab) [id number] Route of submission (phone, fax, portal): fax Phone # Fax # Auth type: Buy/Bill PB Units/visits requested:  Reference number: [id number] Approval from: 10/30/23 to 12/27/23

## 2024-05-05 ENCOUNTER — Telehealth: Payer: Self-pay

## 2024-05-05 NOTE — Telephone Encounter (Signed)
 Patient called and stated that her last Evenity  injection on 11/05/23 was not covered by her copay card. There was an email thread that was already started by Sheena Brennan to [place] with [employer]. Sheena Brennan needed an EOB for that date to send to the program, I emailed her the EOB.   Patient also stated that she has been sent to collections. I emailed [employer] to ask that they take her out of collections to give us  time to figure this copay assistance figure out. [employer] emailed me back stating that they have reversed the bad debt for this encounter.  Patient verbalized understanding and appreciation.

## 2024-10-02 ENCOUNTER — Encounter: Payer: 59 | Admitting: Family Medicine

## 2024-10-19 ENCOUNTER — Ambulatory Visit (INDEPENDENT_AMBULATORY_CARE_PROVIDER_SITE_OTHER): Admitting: Family Medicine

## 2024-10-19 ENCOUNTER — Encounter: Payer: Self-pay | Admitting: Family Medicine

## 2024-10-19 VITALS — BP 110/72 | HR 69 | Temp 98.0°F | Ht 61.0 in | Wt 116.4 lb

## 2024-10-19 DIAGNOSIS — Z13220 Encounter for screening for lipoid disorders: Secondary | ICD-10-CM

## 2024-10-19 DIAGNOSIS — Z79899 Other long term (current) drug therapy: Secondary | ICD-10-CM

## 2024-10-19 DIAGNOSIS — Z0000 Encounter for general adult medical examination without abnormal findings: Secondary | ICD-10-CM

## 2024-10-19 DIAGNOSIS — Z23 Encounter for immunization: Secondary | ICD-10-CM | POA: Diagnosis not present

## 2024-10-19 DIAGNOSIS — M810 Age-related osteoporosis without current pathological fracture: Secondary | ICD-10-CM

## 2024-10-19 DIAGNOSIS — Z131 Encounter for screening for diabetes mellitus: Secondary | ICD-10-CM | POA: Diagnosis not present

## 2024-10-19 LAB — CBC
HCT: 40.8 % (ref 36.0–46.0)
Hemoglobin: 13.9 g/dL (ref 12.0–15.0)
MCHC: 34 g/dL (ref 30.0–36.0)
MCV: 92.2 fl (ref 78.0–100.0)
Platelets: 180 K/uL (ref 150.0–400.0)
RBC: 4.43 Mil/uL (ref 3.87–5.11)
RDW: 13.8 % (ref 11.5–15.5)
WBC: 4.7 K/uL (ref 4.0–10.5)

## 2024-10-19 LAB — HEMOGLOBIN A1C: Hgb A1c MFr Bld: 5.5 % (ref 4.6–6.5)

## 2024-10-19 LAB — COMPREHENSIVE METABOLIC PANEL WITH GFR
ALT: 14 U/L (ref 3–35)
AST: 20 U/L (ref 5–37)
Albumin: 4.4 g/dL (ref 3.5–5.2)
Alkaline Phosphatase: 49 U/L (ref 39–117)
BUN: 22 mg/dL (ref 6–23)
CO2: 31 meq/L (ref 19–32)
Calcium: 9.3 mg/dL (ref 8.4–10.5)
Chloride: 103 meq/L (ref 96–112)
Creatinine, Ser: 0.74 mg/dL (ref 0.40–1.20)
GFR: 87.63 mL/min
Glucose, Bld: 90 mg/dL (ref 70–99)
Potassium: 3.9 meq/L (ref 3.5–5.1)
Sodium: 138 meq/L (ref 135–145)
Total Bilirubin: 0.9 mg/dL (ref 0.2–1.2)
Total Protein: 6.7 g/dL (ref 6.0–8.3)

## 2024-10-19 LAB — LIPID PANEL
Cholesterol: 222 mg/dL — ABNORMAL HIGH (ref 28–200)
HDL: 59.9 mg/dL
LDL Cholesterol: 146 mg/dL — ABNORMAL HIGH (ref 10–99)
NonHDL: 162.19
Total CHOL/HDL Ratio: 4
Triglycerides: 83 mg/dL (ref 10.0–149.0)
VLDL: 16.6 mg/dL (ref 0.0–40.0)

## 2024-10-19 LAB — TSH: TSH: 3.52 u[IU]/mL (ref 0.35–5.50)

## 2024-10-19 LAB — VITAMIN D 25 HYDROXY (VIT D DEFICIENCY, FRACTURES): VITD: 41.67 ng/mL (ref 30.00–100.00)

## 2024-10-19 NOTE — Progress Notes (Signed)
 "     Patient ID: KIMBERLA DRISKILL, female  DOB: 1966/06/22, 59 y.o.   MRN: [id number] Patient Care Team    Relationship Specialty Notifications Start End  Catherine Charlies DELENA, DO PCP - General Family Medicine  03/15/16   Luis Purchase, MD Consulting Physician Gastroenterology  03/19/16   Leva Rush, MD Consulting Physician Obstetrics and Gynecology  04/12/17     Chief Complaint  Patient presents with   Annual Exam    Pt is fasting.     Subjective: ALAYHA BABINEAUX is a 59 y.o.  Female  present for CPE and Chronic Conditions/illness Management All past medical history, surgical history, allergies, family history, immunizations, medications and social history were updated in the electronic medical record today. All recent labs, ED visits and hospitalizations within the last year were reviewed.  Health maintenance:  Colonoscopy: every 5 years for polyps, Dr. Luis, UTD 07/2021. Mammogram:UTD 2025 with GYN-requested  Cervical cancer screening: UTD 2024 with GYN [facility] Immunizations: tdap UTD 03/2016, influenza vaccine-declined, Shingrix declined Infectious disease screening: HIV and Hep C completed DEXA: Osteoporosis reported-DEXA completed gynecology-records requested. Assistive device: none Oxygen [id number] Patient has a Dental home. Hospitalizations/ED visits: Reviewed      10/19/2024    7:52 AM 11/05/2023    8:32 AM 09/02/2023    8:13 AM 09/28/2022    1:04 PM 10/05/2021    8:58 AM  Depression screen PHQ 2/9  Decreased Interest 0 0 0 0 0  Down, Depressed, Hopeless 0 0 0 0 0  PHQ - 2 Score 0 0 0 0 0       No data to display            04/09/2016    8:22 AM 04/21/2018    8:15 AM 09/02/2023    8:12 AM 11/05/2023    8:32 AM 10/19/2024    7:52 AM  Fall Risk  Falls in the past year? No  No  0 0 0  Was there an injury with Fall?     0  Fall Risk Category Calculator     0  Patient at Risk for Falls Due to   No Fall Risks No Fall Risks No Fall Risks  Fall risk Follow up   Falls  evaluation completed Falls evaluation completed Falls evaluation completed     Data saved with a previous flowsheet row definition     Immunization History  Administered Date(s) Administered   Influenza-Unspecified 07/16/2015, 07/15/2018   Tdap 04/09/2016    Past Medical History:  Diagnosis Date   Allergy    Arthritis    Colon polyp 2017   adenoma, Dr. Luis   COVID-19 02/2020   mild flu like symptoms.    Hematuria 03/13/2017   Hemorrhoids    banded multiple times   Inflamed external hemorrhoid 08/15/2021   Insomnia    Osteopenia 2015   By DEXA   Postmenopausal estrogen deficiency 01/24/2016   Urticaria 11/01/2017   Allergies  Allergen Reactions   Demeclocycline Nausea And Vomiting and Other (See Comments)    BRAND NAME: Declomycin- antibiotic   Mushroom Extract Complex (Obsolete) Hives and Other (See Comments)    Mushrooms = HIVES   Tetracyclines & Related Nausea And Vomiting   Past Surgical History:  Procedure Laterality Date   BUNIONECTOMY Bilateral 2017   CESAREAN SECTION  1997, 2000   HEMORRHOID BANDING  03/2016   6th banding   SEPTOPLASTY  1981   Family History  Problem Relation Age of Onset  Osteoporosis Mother    Arthritis Mother    Breast cancer Maternal Grandmother    Heart disease Father    Social History   Social History Narrative   Married to [place]. 2 children (eric and brian)   Bachelors degree, airline pilot, employed.   Denies tobacco, drug or alcohol use.   Drinks caffeinated beverages.   Takes a daily vitamin.   Exercise routinely.   Wears her seatbelt, smoke detector in the home   Firearms in a locked cabinet In the home   Feels safe in her relationships.    Allergies as of 10/19/2024       Reactions   Demeclocycline Nausea And Vomiting, Other (See Comments)   BRAND NAME: Declomycin- antibiotic   Mushroom Extract Complex (obsolete) Hives, Other (See Comments)   Mushrooms = HIVES   Tetracyclines & Related Nausea And Vomiting         Medication List        Accurate as of October 19, 2024  8:04 AM. If you have any questions, ask your nurse or doctor.          Evenity  105 MG/1. Sosy injection Generic drug: Romosozumab -aqqg Inject by subcutaneous route.   Vitamin D3 125 MCG (5000 UT) Caps Take 10,000 Units by mouth every 7 (seven) days.        All past medical history, surgical history, allergies, family history, immunizations andmedications were updated in the EMR today and reviewed under the history and medication portions of their EMR.      Review of Systems  Constitutional: Negative.   HENT: Negative.    Eyes: Negative.   Respiratory: Negative.    Cardiovascular: Negative.   Gastrointestinal: Negative.   Genitourinary: Negative.   Musculoskeletal: Negative.   Skin: Negative.   Neurological: Negative.   Endo/Heme/Allergies: Negative.   Psychiatric/Behavioral: Negative.    All other systems reviewed and are negative.  14 pt review of systems performed and negative (unless mentioned in an HPI)  Objective: BP 110/72   Pulse 69   Temp 98 F (36.7 C)   Ht 5' 1 (1.549 m)   Wt 116 lb 6.4 oz (52.8 kg)   SpO2 97%   BMI 21.99 kg/m  Physical Exam Vitals and nursing note reviewed.  Constitutional:      General: She is not in acute distress.    Appearance: Normal appearance. She is normal weight. She is not ill-appearing or toxic-appearing.  HENT:     Head: Normocephalic and atraumatic.     Right Ear: Tympanic membrane, ear canal and external ear normal. There is no impacted cerumen.     Left Ear: Tympanic membrane, ear canal and external ear normal. There is no impacted cerumen.     Nose: No congestion or rhinorrhea.     Mouth/Throat:     Mouth: Mucous membranes are moist.     Pharynx: Oropharynx is clear. No oropharyngeal exudate or posterior oropharyngeal erythema.  Eyes:     General: No scleral icterus.       Right eye: No discharge.        Left eye: No discharge.      Extraocular Movements: Extraocular movements intact.     Conjunctiva/sclera: Conjunctivae normal.     Pupils: Pupils are equal, round, and reactive to light.  Cardiovascular:     Rate and Rhythm: Normal rate and regular rhythm.     Pulses: Normal pulses.     Heart sounds: Normal heart sounds. No murmur heard.  No friction rub. No gallop.  Pulmonary:     Effort: Pulmonary effort is normal. No respiratory distress.     Breath sounds: Normal breath sounds. No stridor. No wheezing, rhonchi or rales.  Chest:     Chest wall: No tenderness.  Abdominal:     General: Abdomen is flat. Bowel sounds are normal. There is no distension.     Palpations: Abdomen is soft. There is no mass.     Tenderness: There is no abdominal tenderness. There is no right CVA tenderness, left CVA tenderness, guarding or rebound.     Hernia: No hernia is present.  Musculoskeletal:        General: No swelling, tenderness or deformity. Normal range of motion.     Cervical back: Normal range of motion and neck supple. No rigidity or tenderness.     Right lower leg: No edema.     Left lower leg: No edema.  Lymphadenopathy:     Cervical: No cervical adenopathy.  Skin:    General: Skin is warm and dry.     Coloration: Skin is not jaundiced or pale.     Findings: No bruising, erythema, lesion or rash.  Neurological:     General: No focal deficit present.     Mental Status: She is alert and oriented to person, place, and time. Mental status is at baseline.     Cranial Nerves: No cranial nerve deficit.     Sensory: No sensory deficit.     Motor: No weakness.     Coordination: Coordination normal.     Gait: Gait normal.     Deep Tendon Reflexes: Reflexes normal.  Psychiatric:        Mood and Affect: Mood normal.        Behavior: Behavior normal.        Thought Content: Thought content normal.        Judgment: Judgment normal.      No results found.  Assessment/plan: MALEEKA SABATINO is a 59 y.o. female present  for CPE and Chronic Conditions/illness Management Osteoporosis Managed by gynecology Completed 1 yr of  Evenity - 2024 Supplementing vitamin D  Influenza/Prevnar-declined Routine general medical examination at a health care facility Colonoscopy: every 5 years for polyps, Dr. Luis, UTD 07/2021. Mammogram:UTD 2025 with GYN-requested records Cervical cancer screening: UTD 2024 with GYN [facility] Immunizations: tdap UTD 03/2016, influenza vaccine-declined, Shingrix declined Infectious disease screening: HIV and Hep C completed DEXA: Osteoporosis reported-DEXA completed gynecology-records requeste Patient was encouraged to exercise greater than 150 minutes a week. Patient was encouraged to choose a diet filled with fresh fruits and vegetables, and lean meats. AVS provided to patient today for education/recommendation on gender specific health and safety maintenance.   Return in about 1 year (around 10/20/2025) for cpe (20 min).  Orders Placed This Encounter  Procedures   CBC   Comprehensive metabolic panel with GFR   Hemoglobin A1c   Lipid panel   TSH   Vitamin D  (25 hydroxy)   No orders of the defined types were placed in this encounter.  Referral Orders  No referral(s) requested today     Electronically signed by: Charlies Bellini, DO [facility] "

## 2024-10-19 NOTE — Patient Instructions (Addendum)

## 2024-10-20 ENCOUNTER — Ambulatory Visit: Payer: Self-pay | Admitting: Family Medicine
